# Patient Record
Sex: Female | Born: 1955
Health system: Southern US, Community
[De-identification: ages and names within clinical notes are randomized; demographics above are authoritative.]

## PROBLEM LIST (undated history)

## (undated) DIAGNOSIS — Z8619 Personal history of other infectious and parasitic diseases: Secondary | ICD-10-CM

## (undated) DIAGNOSIS — F419 Anxiety disorder, unspecified: Secondary | ICD-10-CM

## (undated) DIAGNOSIS — M1990 Unspecified osteoarthritis, unspecified site: Secondary | ICD-10-CM

## (undated) DIAGNOSIS — M8580 Other specified disorders of bone density and structure, unspecified site: Secondary | ICD-10-CM

## (undated) DIAGNOSIS — K219 Gastro-esophageal reflux disease without esophagitis: Secondary | ICD-10-CM

## (undated) HISTORY — DX: Unspecified osteoarthritis, unspecified site: M19.90

## (undated) HISTORY — PX: REPLACEMENT TOTAL KNEE: SUR1224

## (undated) HISTORY — DX: Personal history of other infectious and parasitic diseases: Z86.19

## (undated) HISTORY — DX: Gastro-esophageal reflux disease without esophagitis: K21.9

## (undated) HISTORY — PX: TONSILLECTOMY: SUR1361

## (undated) HISTORY — PX: TOTAL HIP ARTHROPLASTY: SHX124

## (undated) HISTORY — DX: Anxiety disorder, unspecified: F41.9

## (undated) HISTORY — DX: Other specified disorders of bone density and structure, unspecified site: M85.80

---

## 1997-04-15 ENCOUNTER — Ambulatory Visit (HOSPITAL_COMMUNITY): Admission: RE | Admit: 1997-04-15 | Discharge: 1997-04-15 | Payer: Self-pay | Admitting: Obstetrics and Gynecology

## 1997-08-03 ENCOUNTER — Emergency Department (HOSPITAL_COMMUNITY): Admission: EM | Admit: 1997-08-03 | Discharge: 1997-08-03 | Payer: Self-pay | Admitting: Emergency Medicine

## 1998-09-23 ENCOUNTER — Ambulatory Visit (HOSPITAL_COMMUNITY): Admission: RE | Admit: 1998-09-23 | Discharge: 1998-09-23 | Payer: Self-pay | Admitting: Obstetrics and Gynecology

## 1998-09-23 ENCOUNTER — Encounter: Payer: Self-pay | Admitting: Obstetrics and Gynecology

## 1999-08-04 ENCOUNTER — Ambulatory Visit (HOSPITAL_COMMUNITY): Admission: RE | Admit: 1999-08-04 | Discharge: 1999-08-04 | Payer: Self-pay | Admitting: Obstetrics and Gynecology

## 1999-08-04 ENCOUNTER — Encounter: Payer: Self-pay | Admitting: Obstetrics and Gynecology

## 2000-01-27 ENCOUNTER — Other Ambulatory Visit: Admission: RE | Admit: 2000-01-27 | Discharge: 2000-01-27 | Payer: Self-pay | Admitting: Obstetrics and Gynecology

## 2000-01-27 ENCOUNTER — Encounter (INDEPENDENT_AMBULATORY_CARE_PROVIDER_SITE_OTHER): Payer: Self-pay | Admitting: Specialist

## 2000-08-09 ENCOUNTER — Ambulatory Visit (HOSPITAL_COMMUNITY): Admission: RE | Admit: 2000-08-09 | Discharge: 2000-08-09 | Payer: Self-pay | Admitting: Obstetrics and Gynecology

## 2000-08-09 ENCOUNTER — Encounter: Payer: Self-pay | Admitting: Obstetrics and Gynecology

## 2001-04-03 ENCOUNTER — Other Ambulatory Visit: Admission: RE | Admit: 2001-04-03 | Discharge: 2001-04-03 | Payer: Self-pay | Admitting: Obstetrics and Gynecology

## 2001-08-10 ENCOUNTER — Encounter: Payer: Self-pay | Admitting: Obstetrics and Gynecology

## 2001-08-10 ENCOUNTER — Ambulatory Visit (HOSPITAL_COMMUNITY): Admission: RE | Admit: 2001-08-10 | Discharge: 2001-08-10 | Payer: Self-pay | Admitting: Obstetrics and Gynecology

## 2002-08-16 ENCOUNTER — Ambulatory Visit (HOSPITAL_COMMUNITY): Admission: RE | Admit: 2002-08-16 | Discharge: 2002-08-16 | Payer: Self-pay | Admitting: Obstetrics and Gynecology

## 2002-08-16 ENCOUNTER — Encounter: Payer: Self-pay | Admitting: Obstetrics and Gynecology

## 2003-11-04 ENCOUNTER — Ambulatory Visit (HOSPITAL_COMMUNITY): Admission: RE | Admit: 2003-11-04 | Discharge: 2003-11-04 | Payer: Self-pay | Admitting: Obstetrics and Gynecology

## 2005-01-21 ENCOUNTER — Ambulatory Visit (HOSPITAL_COMMUNITY): Admission: RE | Admit: 2005-01-21 | Discharge: 2005-01-21 | Payer: Self-pay | Admitting: Obstetrics and Gynecology

## 2006-03-03 ENCOUNTER — Ambulatory Visit (HOSPITAL_COMMUNITY): Admission: RE | Admit: 2006-03-03 | Discharge: 2006-03-03 | Payer: Self-pay | Admitting: Obstetrics and Gynecology

## 2007-03-16 ENCOUNTER — Ambulatory Visit (HOSPITAL_COMMUNITY): Admission: RE | Admit: 2007-03-16 | Discharge: 2007-03-16 | Payer: Self-pay | Admitting: Obstetrics and Gynecology

## 2008-03-27 ENCOUNTER — Ambulatory Visit (HOSPITAL_COMMUNITY): Admission: RE | Admit: 2008-03-27 | Discharge: 2008-03-27 | Payer: Self-pay | Admitting: Obstetrics and Gynecology

## 2009-05-06 ENCOUNTER — Ambulatory Visit (HOSPITAL_COMMUNITY): Admission: RE | Admit: 2009-05-06 | Discharge: 2009-05-06 | Payer: Self-pay | Admitting: Obstetrics and Gynecology

## 2010-05-28 ENCOUNTER — Other Ambulatory Visit (HOSPITAL_COMMUNITY): Payer: Self-pay | Admitting: Obstetrics and Gynecology

## 2010-05-28 DIAGNOSIS — Z1231 Encounter for screening mammogram for malignant neoplasm of breast: Secondary | ICD-10-CM

## 2010-06-08 ENCOUNTER — Ambulatory Visit (HOSPITAL_COMMUNITY)
Admission: RE | Admit: 2010-06-08 | Discharge: 2010-06-08 | Disposition: A | Discharge status: Home / Self Care | Payer: Self-pay | Source: Ambulatory Visit | Attending: Obstetrics and Gynecology | Admitting: Obstetrics and Gynecology

## 2010-06-08 DIAGNOSIS — Z1231 Encounter for screening mammogram for malignant neoplasm of breast: Secondary | ICD-10-CM | POA: Insufficient documentation

## 2011-05-27 ENCOUNTER — Other Ambulatory Visit (HOSPITAL_COMMUNITY): Payer: Self-pay | Admitting: Obstetrics and Gynecology

## 2011-05-27 DIAGNOSIS — Z1231 Encounter for screening mammogram for malignant neoplasm of breast: Secondary | ICD-10-CM

## 2011-07-08 ENCOUNTER — Ambulatory Visit (HOSPITAL_COMMUNITY)
Admission: RE | Admit: 2011-07-08 | Discharge: 2011-07-08 | Disposition: A | Discharge status: Home / Self Care | Payer: Self-pay | Source: Ambulatory Visit | Attending: Obstetrics and Gynecology | Admitting: Obstetrics and Gynecology

## 2011-07-08 DIAGNOSIS — Z1231 Encounter for screening mammogram for malignant neoplasm of breast: Secondary | ICD-10-CM | POA: Insufficient documentation

## 2012-06-26 ENCOUNTER — Other Ambulatory Visit (HOSPITAL_COMMUNITY): Payer: Self-pay | Admitting: Obstetrics and Gynecology

## 2012-06-26 DIAGNOSIS — Z1231 Encounter for screening mammogram for malignant neoplasm of breast: Secondary | ICD-10-CM

## 2012-07-11 ENCOUNTER — Ambulatory Visit (HOSPITAL_COMMUNITY): Payer: Self-pay

## 2012-07-20 ENCOUNTER — Ambulatory Visit (HOSPITAL_COMMUNITY)
Admission: RE | Admit: 2012-07-20 | Discharge: 2012-07-20 | Disposition: A | Discharge status: Home / Self Care | Payer: Self-pay | Source: Ambulatory Visit | Attending: Obstetrics and Gynecology | Admitting: Obstetrics and Gynecology

## 2012-07-20 DIAGNOSIS — Z1231 Encounter for screening mammogram for malignant neoplasm of breast: Secondary | ICD-10-CM | POA: Insufficient documentation

## 2013-09-25 ENCOUNTER — Ambulatory Visit: Payer: Self-pay | Admitting: Family Medicine

## 2013-10-02 ENCOUNTER — Ambulatory Visit (INDEPENDENT_AMBULATORY_CARE_PROVIDER_SITE_OTHER): Payer: BC Managed Care – PPO | Admitting: Family Medicine

## 2013-10-02 ENCOUNTER — Encounter: Payer: Self-pay | Admitting: Family Medicine

## 2013-10-02 VITALS — BP 100/70 | HR 69 | Temp 97.9°F | Ht 65.5 in | Wt 183.5 lb

## 2013-10-02 DIAGNOSIS — Z91030 Bee allergy status: Secondary | ICD-10-CM

## 2013-10-02 DIAGNOSIS — Z91038 Other insect allergy status: Secondary | ICD-10-CM

## 2013-10-02 DIAGNOSIS — Z7689 Persons encountering health services in other specified circumstances: Secondary | ICD-10-CM

## 2013-10-02 DIAGNOSIS — E669 Obesity, unspecified: Secondary | ICD-10-CM

## 2013-10-02 DIAGNOSIS — Z8742 Personal history of other diseases of the female genital tract: Secondary | ICD-10-CM

## 2013-10-02 DIAGNOSIS — Z7189 Other specified counseling: Secondary | ICD-10-CM

## 2013-10-02 DIAGNOSIS — R946 Abnormal results of thyroid function studies: Secondary | ICD-10-CM

## 2013-10-02 LAB — T4, FREE: FREE T4: 0.73 ng/dL (ref 0.60–1.60)

## 2013-10-02 LAB — TSH: TSH: 2.99 u[IU]/mL (ref 0.35–4.50)

## 2013-10-02 NOTE — Patient Instructions (Signed)
Vitamin D3 1000 IU dialy; 1200mg  of calcium in diet + or - supplement  -We have ordered labs or studies at this visit. It can take up to 1-2 weeks for results and processing. We will contact you with instructions IF your results are abnormal. Normal results will be released to your [facility]. If you have not heard from [place] or can not find your results in [facility] in 2 weeks please contact our office.  -PLEASE SIGN UP FOR MYCHART TODAY   We recommend the following healthy lifestyle measures: - eat a healthy diet consisting of lots of vegetables, fruits, beans, nuts, seeds, healthy meats such as white chicken and fish and whole grains.  - avoid fried foods, fast food, processed foods, sodas, red meet and other fattening foods.  - get a least 150 minutes of aerobic exercise per week.   Follow up in: 3 months or as needed

## 2013-10-02 NOTE — Progress Notes (Signed)
No chief complaint on file.   HPI:  Patricia Burns is here to establish care.  Last PCP and physical: 04/2013 with all labs done in 07/2013- TSH a little elevated at 8.03;   Has the following chronic problems and concerns today:  Patient Active Problem List   Diagnosis Date Noted  . Hx of abnormal cervical Pap smear - sees gyn 10/02/2013  . H/O bee sting allergy 10/02/2013  . Borderline abnormal TFTs 10/02/2013   Abnormal TFT: -TSH mildly elevated at gyn visit -some fatigue, but unchanged, occ cold intolerance, no lat eyebrown thinning -denies: constipation, weight gain, hair or skin changes, depression  ROS negative for unless reported above: fevers, unintentional weight loss, hearing or vision loss, chest pain, palpitations, struggling to breath, hemoptysis, melena, hematochezia, hematuria, falls, loc, si, thoughts of self harm  Past Medical History  Diagnosis Date  . History of chicken pox     Family History  Problem Relation Age of Onset  . Diabetes Father   . Arthritis Father   . Cancer Father     prostate  . Heart disease Paternal Grandmother   . Heart disease Paternal Grandfather     History   Social History  . Marital Status: Divorced    Spouse Name: N/A    Number of Children: N/A  . Years of Education: N/A   Social History Main Topics  . Smoking status: Never Smoker   . Smokeless tobacco: None  . Alcohol Use: None  . Drug Use: None  . Sexual Activity: None   Other Topics Concern  . None   Social History Narrative   Work or School: Acupuncturist Situation: lives with duaghter      Spiritual Beliefs: Christian      Lifestyle: no regular exercise; diet is ok             Current outpatient prescriptions:aspirin 81 MG tablet, Take 81 mg by mouth daily., Disp: , Rfl: ;  calcium carbonate (OS-CAL) 600 MG TABS tablet, Take 600 mg by mouth daily., Disp: , Rfl: ;  ibuprofen (ADVIL,MOTRIN) 200 MG tablet, Take 200 mg by mouth every 6 (six)  hours as needed., Disp: , Rfl: ;  Multiple Vitamins-Minerals (ALIVE WOMENS 50+ PO), Take by mouth daily., Disp: , Rfl:   EXAM:  Filed Vitals:   10/02/13 1114  BP: 100/70  Pulse: 69  Temp: 97.9 F (36.6 C)    Body mass index is 30.06 kg/(m^2).  GENERAL: vitals reviewed and listed above, alert, oriented, appears well hydrated and in no acute distress  HEENT: atraumatic, conjunttiva clear, no obvious abnormalities on inspection of external nose and ears  NECK: no obvious masses on inspection  LUNGS: clear to auscultation bilaterally, no wheezes, rales or rhonchi, good air movement  CV: HRRR, no peripheral edema  MS: moves all extremities without noticeable abnormality  PSYCH: pleasant and cooperative, no obvious depression or anxiety  ASSESSMENT AND PLAN:  Discussed the following assessment and plan:  Hx of abnormal cervical Pap smear - sees gyn  ? Of H/O bee sting allergy: -gave her number to call for evaluation with allergist, she will consider, emergency precuations  Borderline abnormal TFTs - Plan: TSH, T4, Free -repeat testing  Encounter to establish care  Obesity: -discussed importance of healthy diet and exercise  -We reviewed the PMH, PSH, FH, SH, Meds and Allergies. -We provided refills for any medications we will prescribe as needed. -We addressed current concerns per orders  and patient instructions. -We have asked for records for pertinent exams, studies, vaccines and notes from previous providers. -We have advised patient to follow up per instructions below.  Review health maintenance at next visit  -Patient advised to return or notify a doctor immediately if symptoms worsen or persist or new concerns arise.  Patient Instructions  Vitamin D3 1000 IU dialy; 1200mg  of calcium in diet + or - supplement  -We have ordered labs or studies at this visit. It can take up to 1-2 weeks for results and processing. We will contact you with instructions IF your  results are abnormal. Normal results will be released to your [facility]. If you have not heard from [place] or can not find your results in [facility] in 2 weeks please contact our office.  -PLEASE SIGN UP FOR MYCHART TODAY   We recommend the following healthy lifestyle measures: - eat a healthy diet consisting of lots of vegetables, fruits, beans, nuts, seeds, healthy meats such as white chicken and fish and whole grains.  - avoid fried foods, fast food, processed foods, sodas, red meet and other fattening foods.  - get a least 150 minutes of aerobic exercise per week.   Follow up in: 3 months or as needed      KIM, HANNAH R.

## 2013-10-02 NOTE — Progress Notes (Signed)
Pre visit review using our clinic review tool, if applicable. No additional management support is needed unless otherwise documented below in the visit note. 

## 2013-10-03 ENCOUNTER — Encounter: Payer: Self-pay | Admitting: *Deleted

## 2014-01-08 ENCOUNTER — Ambulatory Visit: Payer: BC Managed Care – PPO | Admitting: Family Medicine

## 2014-06-11 ENCOUNTER — Encounter: Payer: Self-pay | Admitting: Family Medicine

## 2014-06-11 ENCOUNTER — Ambulatory Visit (INDEPENDENT_AMBULATORY_CARE_PROVIDER_SITE_OTHER): Payer: BLUE CROSS/BLUE SHIELD | Admitting: Family Medicine

## 2014-06-11 VITALS — BP 112/61 | HR 70 | Temp 98.6°F | Ht 65.5 in | Wt 182.0 lb

## 2014-06-11 DIAGNOSIS — J020 Streptococcal pharyngitis: Secondary | ICD-10-CM

## 2014-06-11 LAB — POCT RAPID STREP A (OFFICE): RAPID STREP A SCREEN: NEGATIVE

## 2014-06-11 MED ORDER — CEPHALEXIN 500 MG PO CAPS: 500.0000 mg | ORAL_CAPSULE | Freq: Three times a day (TID) | ORAL | Status: AC

## 2014-06-11 NOTE — Progress Notes (Signed)
Pre visit review using our clinic review tool, if applicable. No additional management support is needed unless otherwise documented below in the visit note. 

## 2014-06-11 NOTE — Progress Notes (Signed)
   Subjective:    Patient ID: Patricia Burns, female    DOB: 1955-08-14, 59 y.o.   MRN: [id number]  HPI Here with 2 days of a ST. No fever or cough. She took care of her young grandchild over the weekend who was diagnosed with scarlet fever yesterday.   Review of Systems  Constitutional: Negative.   HENT: Positive for sore throat. Negative for congestion, postnasal drip and sinus pressure.   Eyes: Negative.   Respiratory: Negative.        Objective:   Physical Exam  Constitutional: She appears well-developed and well-nourished.  HENT:  Right Ear: External ear normal.  Left Ear: External ear normal.  Nose: Nose normal.  Posterior OP is red without exudate   Eyes: Conjunctivae are normal.  Neck: No thyromegaly present.  Pulmonary/Chest: Effort normal and breath sounds normal.  Lymphadenopathy:    She has no cervical adenopathy.          Assessment & Plan:  Probable strep throat. Treat with Keflex.

## 2015-06-19 ENCOUNTER — Encounter: Payer: Self-pay | Admitting: Family Medicine

## 2015-06-19 DIAGNOSIS — Z1389 Encounter for screening for other disorder: Secondary | ICD-10-CM | POA: Diagnosis not present

## 2015-06-19 DIAGNOSIS — Z01419 Encounter for gynecological examination (general) (routine) without abnormal findings: Secondary | ICD-10-CM | POA: Diagnosis not present

## 2015-06-19 DIAGNOSIS — Z124 Encounter for screening for malignant neoplasm of cervix: Secondary | ICD-10-CM | POA: Diagnosis not present

## 2015-06-19 DIAGNOSIS — Z130 Encounter for screening for diseases of the blood and blood-forming organs and certain disorders involving the immune mechanism: Secondary | ICD-10-CM | POA: Diagnosis not present

## 2015-06-19 LAB — HM PAP SMEAR

## 2015-07-20 DIAGNOSIS — Z1231 Encounter for screening mammogram for malignant neoplasm of breast: Secondary | ICD-10-CM | POA: Diagnosis not present

## 2015-09-04 DIAGNOSIS — Z01419 Encounter for gynecological examination (general) (routine) without abnormal findings: Secondary | ICD-10-CM | POA: Diagnosis not present

## 2015-12-21 DIAGNOSIS — M5431 Sciatica, right side: Secondary | ICD-10-CM | POA: Diagnosis not present

## 2015-12-21 DIAGNOSIS — M1611 Unilateral primary osteoarthritis, right hip: Secondary | ICD-10-CM | POA: Diagnosis not present

## 2015-12-24 ENCOUNTER — Other Ambulatory Visit: Payer: Self-pay | Admitting: Sports Medicine

## 2015-12-24 DIAGNOSIS — M545 Low back pain: Secondary | ICD-10-CM

## 2016-01-03 ENCOUNTER — Ambulatory Visit
Admission: RE | Admit: 2016-01-03 | Discharge: 2016-01-03 | Disposition: A | Discharge status: Home / Self Care | Payer: BLUE CROSS/BLUE SHIELD | Source: Ambulatory Visit | Attending: Sports Medicine | Admitting: Sports Medicine

## 2016-01-03 DIAGNOSIS — M48061 Spinal stenosis, lumbar region without neurogenic claudication: Secondary | ICD-10-CM | POA: Diagnosis not present

## 2016-01-03 DIAGNOSIS — M545 Low back pain: Secondary | ICD-10-CM

## 2016-01-05 DIAGNOSIS — M545 Low back pain: Secondary | ICD-10-CM | POA: Diagnosis not present

## 2016-01-20 DIAGNOSIS — M5431 Sciatica, right side: Secondary | ICD-10-CM | POA: Diagnosis not present

## 2016-01-20 DIAGNOSIS — M545 Low back pain: Secondary | ICD-10-CM | POA: Diagnosis not present

## 2016-02-05 DIAGNOSIS — M5431 Sciatica, right side: Secondary | ICD-10-CM | POA: Diagnosis not present

## 2016-02-05 DIAGNOSIS — M545 Low back pain: Secondary | ICD-10-CM | POA: Diagnosis not present

## 2016-03-01 DIAGNOSIS — M5106 Intervertebral disc disorders with myelopathy, lumbar region: Secondary | ICD-10-CM | POA: Diagnosis not present

## 2016-03-14 DIAGNOSIS — M25551 Pain in right hip: Secondary | ICD-10-CM | POA: Diagnosis not present

## 2016-03-14 DIAGNOSIS — M545 Low back pain: Secondary | ICD-10-CM | POA: Diagnosis not present

## 2016-03-23 DIAGNOSIS — M5106 Intervertebral disc disorders with myelopathy, lumbar region: Secondary | ICD-10-CM | POA: Diagnosis not present

## 2016-03-23 DIAGNOSIS — M25551 Pain in right hip: Secondary | ICD-10-CM | POA: Diagnosis not present

## 2016-03-24 ENCOUNTER — Ambulatory Visit (INDEPENDENT_AMBULATORY_CARE_PROVIDER_SITE_OTHER): Payer: BLUE CROSS/BLUE SHIELD | Admitting: Family Medicine

## 2016-03-24 ENCOUNTER — Encounter: Payer: Self-pay | Admitting: Family Medicine

## 2016-03-24 VITALS — BP 100/70 | HR 79 | Temp 97.5°F | Ht 65.5 in | Wt 185.5 lb

## 2016-03-24 DIAGNOSIS — Z01818 Encounter for other preprocedural examination: Secondary | ICD-10-CM

## 2016-03-24 DIAGNOSIS — Z23 Encounter for immunization: Secondary | ICD-10-CM | POA: Diagnosis not present

## 2016-03-24 LAB — CBC
HCT: 43.9 % (ref 36.0–46.0)
HEMOGLOBIN: 14.8 g/dL (ref 12.0–15.0)
MCHC: 33.8 g/dL (ref 30.0–36.0)
MCV: 95.3 fl (ref 78.0–100.0)
PLATELETS: 253 10*3/uL (ref 150.0–400.0)
RBC: 4.6 Mil/uL (ref 3.87–5.11)
RDW: 12.7 % (ref 11.5–15.5)
WBC: 8 10*3/uL (ref 4.0–10.5)

## 2016-03-24 LAB — BASIC METABOLIC PANEL
BUN: 17 mg/dL (ref 6–23)
CHLORIDE: 104 meq/L (ref 96–112)
CO2: 31 mEq/L (ref 19–32)
Calcium: 9.2 mg/dL (ref 8.4–10.5)
Creatinine, Ser: 0.79 mg/dL (ref 0.40–1.20)
GFR: 78.78 mL/min (ref >60.00)
Glucose, Bld: 79 mg/dL (ref 70–99)
POTASSIUM: 4 meq/L (ref 3.5–5.1)
SODIUM: 141 meq/L (ref 135–145)

## 2016-03-24 LAB — HEMOGLOBIN A1C: Hgb A1c MFr Bld: 5.5 % (ref 4.6–6.5)

## 2016-03-24 LAB — TSH: TSH: 3 u[IU]/mL (ref 0.35–4.50)

## 2016-03-24 NOTE — Patient Instructions (Addendum)
BEFORE YOU LEAVE: -offer cologard -obtain and update/abstract mammogram -EKG -flu shot -labs -follow up: physical in 3 months, come fasting if possible  We have ordered labs or studies at this visit. It can take up to 1-2 weeks for results and processing. IF results require follow up or explanation, we will call you with instructions. Clinically stable results will be released to your [facility]. If you have not heard from [place] or cannot find your results in [facility] in 2 weeks please contact our office at [phone].   If you are not yet signed up for [facility], please SIGN UP TODAY. We now offer online scheduling, same day appointments and extended hours. WHEN YOU DON'T FEEL YOUR BEST.Marland KitchenMarland KitchenWE ARE HERE TO HELP.   We recommend the following healthy lifestyle for LIFE: 1) Small portions.   Tip: eat off of a salad plate instead of a dinner plate.  Tip: if you need more or a snack choose fruits, veggies and/or a handful of nuts or seeds.  2) Eat a healthy clean diet.  * Tip: Avoid (less then 1 serving per week): processed foods, sweets, sweetened drinks, white starches (rice, flour, bread, potatoes, pasta, etc), red meat, fast foods, butter  *Tip: CHOOSE instead   * 5-9 servings per day of fresh or frozen fruits and vegetables (but not corn, potatoes, bananas, canned or dried fruit)   *nuts and seeds, beans   *olives and olive oil   *small portions of lean meats such as fish and white chicken    *small portions of whole grains  3)Get at least 150 minutes of sweaty aerobic exercise per week.  4)Reduce stress - consider counseling, meditation and relaxation to balance other aspects of your life.

## 2016-03-24 NOTE — Progress Notes (Signed)
Pre visit review using our clinic review tool, if applicable. No additional management support is needed unless otherwise documented below in the visit note. 

## 2016-03-24 NOTE — Progress Notes (Signed)
No chief complaint on file.   HPI:  Patient is seen for optimization of general medical care prior to surgery. She saw [place] once 3 years ago. Reports has not health problems and sees gyn. Reports up to date on mammogram and pap.  Surgery type: R Total hip replacement - with Dr. Percell Miller Date of surgery:  TBA  Kidney disease? No Prior surgeries/Issues following anesthesia? C - section, no known complications in the past Hx MI, heart arrythmia, CHF, angina or stroke? None, no DOE, cp, palpitations, cp with exertion Epilepsy or Seizures? none Arthritis or problems with neck or jaw? None,except ? TMJ Thyroid disease? Reports none, but then reports mild findings in the past with her gynecologist told not of concern Liver disease? none Asthma, COPD or chronic lung disease? none Diabetes? none (Needs to be evaluated by anesthesia if yes to these questions.)  Other: Poor nutrition, Frail or other: no  METS:  Cleans houses ?Can take care of self, such as eat, dress, or use the toilet (1 MET). yes ?Can walk up a flight of steps or a hill (4 METs).yes ?Can do heavy work around the house such as scrubbing floors or lifting or moving heavy furniture (between 4 and 10 METs). yes ?Can participate in strenuous sports such as swimming, singles tennis, football, basketball, and skiing (>10 METs): swims currently sometimes . AHA Risks: Major predictors that require intensive management and may lead to delay in or cancellation of the operative procedure unless emergent: NONE  . Unstable coronary syndromes including unstable or severe angina or recent MI  . Decompensated heart failure including NYHA functional class IV or worsening or new-onset HF  . Significant arrhythmias including high grade AV block, symptomatic ventricular arrhythmias, supraventricular arrhythmias with ventricular rate >100 bpm at rest, symptomatic bradycardia, and newly recognized ventricular tachycardia  . Severe heart valve disease  including severe aortic stenosis or symptomatic mitral stenosis   Other clinical predictors that warrant careful assessment of current status: NONE  . History of ischemic heart disease . History of cerebrovascular disease  . History of compensated heart failure or prior heart failure  . Diabetes mellitus  . Renal insufficiency  Type of surgery and Risk: 1) High risk (reported risk of cardiac death or nonfatal myocardial infarction [MI] often greater than 5 percent):  Marland Kitchen Aortic and other major vascular surgery  . Peripheral artery surgery   2)Intermediate risk (reported risk of cardiac death or nonfatal MI generally 1 to 5 percent):  Marland Kitchen Carotid endarterectomy  . Head and neck surgery  . Intraperitoneal and intrathoracic surgery  . Orthopedic surgery  . Prostate surgery   3)Low risk (reported risk of cardiac death or nonfatal MI generally less than 1 percent):  Marland Kitchen Ambulatory surgery  . Endoscopic procedures  . Superficial procedure  . Cataract surgery  . Breast surgery  Medications that need to be addressed prior to surgery: Per surgeon's recommendation - patient not taking anything now Discontinue acei/arbs/non-statin lipid lowering drugs day of surgery ASA stop 7 days before or discuss with cardiology if CV risks, other anticoagulants discuss with cardiology.  ROS: See pertinent positives and negatives per HPI. 11 point ROS negative except where noted.   Past Medical History:  Diagnosis Date  . History of chicken pox     Past Surgical History:  Procedure Laterality Date  . CESAREAN SECTION     x2-1983, 1984  . TONSILLECTOMY      Family History  Problem Relation Age of Onset  . Diabetes  Father   . Arthritis Father   . Cancer Father     prostate  . Heart disease Paternal Grandmother   . Heart disease Paternal Grandfather     Social History   Social History  . Marital status: Divorced    Spouse name: N/A  . Number of children: N/A  . Years of education: N/A    Social History Main Topics  . Smoking status: Never Smoker  . Smokeless tobacco: Never Used  . Alcohol use 0.0 oz/week     Comment: occ  . Drug use: No  . Sexual activity: Not Asked   Other Topics Concern  . 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  Ascorbic Acid (VITAMIN C PO), Take by mouth., Disp: , Rfl:  .  calcium carbonate (OS-CAL) 600 MG TABS tablet, Take 600 mg by mouth daily., Disp: , Rfl:  .  ibuprofen (ADVIL,MOTRIN) 200 MG tablet, Take 200 mg by mouth every 6 (six) hours as needed., Disp: , Rfl:  .  Multiple Vitamins-Minerals (ALIVE WOMENS 50+ PO), Take by mouth daily., Disp: , Rfl:   EXAM:  Vitals:   03/24/16 1051  BP: 100/70  Pulse: 79  Temp: 97.5 F (36.4 C)    Body mass index is 30.4 kg/m.  GENERAL: vitals reviewed and listed above, alert, oriented, appears well hydrated and in no acute distress  HEENT: atraumatic, conjunttiva clear, no obvious abnormalities on inspection of external nose and ears  NECK: no obvious masses on inspection, no carotid bruits  LUNGS: clear to auscultation bilaterally, no wheezes, rales or rhonchi, good air movement  CV: HRRR, no peripheral edema, no JVD, BP normal range, normal radial pulses  MS: moves all extremities without noticeable abnormality  PSYCH: pleasant and cooperative, no obvious depression or anxiety  ASSESSMENT AND PLAN:  Discussed the following assessment and plan: More than 50% of over 30 minutes minutes spent in total in caring for this patient was spent face-to-face with the patient, counseling and/or coordinating care.    Preop examination - Plan: Basic metabolic panel, CBC, Hemoglobin A1c, TSH, EKG 12-Lead, EKG 12-Lead  Encounter for immunization - Plan: Flu Vaccine QUAD 36+ mos  IM  Assessment: -Risk factors: none -Surgery Risks:intermediate -age, nutritional status, fraility: good nutritional status, age >1, no fraility -functional capacity: > 4 METs wethout symptoms -comorbidities: none Patient Specific Risks: patient is low risk for intermediate risks surgery  Recommendations for optimizing general medical care prior to surgery: -given lack of regular medical care and that I have not seen her but once several years ago obtained EKG, labs  -advise evaluation with anesthesiology prior to any general anesthesia given possible hx thyroid issues and TMJ -advised patient to discuss specific risks morbidity and mortality of surgery with surgeon, CV risks discussed with patient -advised patient will defer to surgeon for post-op DVT prophylaxis and post op care -no specific medical recommendations for this patient at this time and no recommendations to defer surgery or for further CV testing prior to surgery   -Patient advised to return or notify a doctor immediately if symptoms worsen or persist or new concerns arise.  Patient Instructions  BEFORE YOU LEAVE: -offer cologard -obtain and  update/abstract mammogram -EKG -flu shot -labs -follow up: physical in 3 months, come fasting if possible  We have ordered labs or studies at this visit. It can take up to 1-2 weeks for results and processing. IF results require follow up or explanation, we will call you with instructions. Clinically stable results will be released to your [facility]. If you have not heard from [place] or cannot find your results in [facility] in 2 weeks please contact our office at [phone].   If you are not yet signed up for [facility], please SIGN UP TODAY. We now offer online scheduling, same day appointments and extended hours. WHEN YOU DON'T FEEL YOUR BEST.Marland KitchenMarland KitchenWE ARE HERE TO HELP.   We recommend the following healthy lifestyle for LIFE: 1) Small portions.   Tip: eat off of a salad plate instead of  a dinner plate.  Tip: if you need more or a snack choose fruits, veggies and/or a handful of nuts or seeds.  2) Eat a healthy clean diet.  * Tip: Avoid (less then 1 serving per week): processed foods, sweets, sweetened drinks, white starches (rice, flour, bread, potatoes, pasta, etc), red meat, fast foods, butter  *Tip: CHOOSE instead   * 5-9 servings per day of fresh or frozen fruits and vegetables (but not corn, potatoes, bananas, canned or dried fruit)   *nuts and seeds, beans   *olives and olive oil   *small portions of lean meats such as fish and white chicken    *small portions of whole grains  3)Get at least 150 minutes of sweaty aerobic exercise per week.  4)Reduce stress - consider counseling, meditation and relaxation to balance other aspects of your life.               Colin Benton R.

## 2016-04-04 DIAGNOSIS — M545 Low back pain: Secondary | ICD-10-CM | POA: Diagnosis not present

## 2016-04-04 DIAGNOSIS — M1712 Unilateral primary osteoarthritis, left knee: Secondary | ICD-10-CM | POA: Diagnosis not present

## 2016-04-04 DIAGNOSIS — M1611 Unilateral primary osteoarthritis, right hip: Secondary | ICD-10-CM | POA: Diagnosis not present

## 2016-04-07 DIAGNOSIS — M1611 Unilateral primary osteoarthritis, right hip: Secondary | ICD-10-CM | POA: Diagnosis not present

## 2016-04-12 DIAGNOSIS — M1611 Unilateral primary osteoarthritis, right hip: Secondary | ICD-10-CM | POA: Diagnosis not present

## 2016-04-12 DIAGNOSIS — Z96641 Presence of right artificial hip joint: Secondary | ICD-10-CM | POA: Diagnosis not present

## 2016-04-12 DIAGNOSIS — R262 Difficulty in walking, not elsewhere classified: Secondary | ICD-10-CM | POA: Diagnosis not present

## 2016-04-12 DIAGNOSIS — M6281 Muscle weakness (generalized): Secondary | ICD-10-CM | POA: Diagnosis not present

## 2016-04-12 DIAGNOSIS — M25551 Pain in right hip: Secondary | ICD-10-CM | POA: Diagnosis not present

## 2016-04-20 DIAGNOSIS — R262 Difficulty in walking, not elsewhere classified: Secondary | ICD-10-CM | POA: Diagnosis not present

## 2016-04-20 DIAGNOSIS — M6281 Muscle weakness (generalized): Secondary | ICD-10-CM | POA: Diagnosis not present

## 2016-04-20 DIAGNOSIS — M1712 Unilateral primary osteoarthritis, left knee: Secondary | ICD-10-CM | POA: Diagnosis not present

## 2016-04-20 DIAGNOSIS — M25551 Pain in right hip: Secondary | ICD-10-CM | POA: Diagnosis not present

## 2016-04-20 DIAGNOSIS — M1611 Unilateral primary osteoarthritis, right hip: Secondary | ICD-10-CM | POA: Diagnosis not present

## 2016-04-28 DIAGNOSIS — M1611 Unilateral primary osteoarthritis, right hip: Secondary | ICD-10-CM | POA: Diagnosis not present

## 2016-04-28 DIAGNOSIS — M6281 Muscle weakness (generalized): Secondary | ICD-10-CM | POA: Diagnosis not present

## 2016-04-28 DIAGNOSIS — M25551 Pain in right hip: Secondary | ICD-10-CM | POA: Diagnosis not present

## 2016-04-28 DIAGNOSIS — R262 Difficulty in walking, not elsewhere classified: Secondary | ICD-10-CM | POA: Diagnosis not present

## 2016-04-29 DIAGNOSIS — M1712 Unilateral primary osteoarthritis, left knee: Secondary | ICD-10-CM | POA: Diagnosis not present

## 2016-05-04 DIAGNOSIS — M1712 Unilateral primary osteoarthritis, left knee: Secondary | ICD-10-CM | POA: Diagnosis not present

## 2016-05-05 DIAGNOSIS — M6281 Muscle weakness (generalized): Secondary | ICD-10-CM | POA: Diagnosis not present

## 2016-05-05 DIAGNOSIS — M25551 Pain in right hip: Secondary | ICD-10-CM | POA: Diagnosis not present

## 2016-05-05 DIAGNOSIS — M1611 Unilateral primary osteoarthritis, right hip: Secondary | ICD-10-CM | POA: Diagnosis not present

## 2016-05-05 DIAGNOSIS — Z96641 Presence of right artificial hip joint: Secondary | ICD-10-CM | POA: Diagnosis not present

## 2016-05-05 DIAGNOSIS — R262 Difficulty in walking, not elsewhere classified: Secondary | ICD-10-CM | POA: Diagnosis not present

## 2016-05-11 DIAGNOSIS — M25551 Pain in right hip: Secondary | ICD-10-CM | POA: Diagnosis not present

## 2016-05-11 DIAGNOSIS — Z96641 Presence of right artificial hip joint: Secondary | ICD-10-CM | POA: Diagnosis not present

## 2016-05-11 DIAGNOSIS — M1611 Unilateral primary osteoarthritis, right hip: Secondary | ICD-10-CM | POA: Diagnosis not present

## 2016-05-11 DIAGNOSIS — M6281 Muscle weakness (generalized): Secondary | ICD-10-CM | POA: Diagnosis not present

## 2016-06-15 DIAGNOSIS — M1611 Unilateral primary osteoarthritis, right hip: Secondary | ICD-10-CM | POA: Diagnosis not present

## 2016-06-20 DIAGNOSIS — Z1231 Encounter for screening mammogram for malignant neoplasm of breast: Secondary | ICD-10-CM | POA: Diagnosis not present

## 2016-06-20 DIAGNOSIS — Z124 Encounter for screening for malignant neoplasm of cervix: Secondary | ICD-10-CM | POA: Diagnosis not present

## 2016-06-20 DIAGNOSIS — Z1389 Encounter for screening for other disorder: Secondary | ICD-10-CM | POA: Diagnosis not present

## 2016-06-20 DIAGNOSIS — Z130 Encounter for screening for diseases of the blood and blood-forming organs and certain disorders involving the immune mechanism: Secondary | ICD-10-CM | POA: Diagnosis not present

## 2016-06-20 DIAGNOSIS — Z01419 Encounter for gynecological examination (general) (routine) without abnormal findings: Secondary | ICD-10-CM | POA: Diagnosis not present

## 2016-06-20 DIAGNOSIS — Z6830 Body mass index (BMI) 30.0-30.9, adult: Secondary | ICD-10-CM | POA: Diagnosis not present

## 2016-06-20 LAB — CBC AND DIFFERENTIAL: Hemoglobin: 13.2 g/dL (ref 12.0–16.0)

## 2016-06-21 DIAGNOSIS — Z1211 Encounter for screening for malignant neoplasm of colon: Secondary | ICD-10-CM | POA: Diagnosis not present

## 2016-06-21 DIAGNOSIS — Z124 Encounter for screening for malignant neoplasm of cervix: Secondary | ICD-10-CM | POA: Diagnosis not present

## 2016-06-23 DIAGNOSIS — M1712 Unilateral primary osteoarthritis, left knee: Secondary | ICD-10-CM | POA: Diagnosis not present

## 2016-06-23 DIAGNOSIS — M25562 Pain in left knee: Secondary | ICD-10-CM | POA: Diagnosis not present

## 2016-07-18 DIAGNOSIS — M1712 Unilateral primary osteoarthritis, left knee: Secondary | ICD-10-CM | POA: Diagnosis not present

## 2016-07-25 DIAGNOSIS — M25662 Stiffness of left knee, not elsewhere classified: Secondary | ICD-10-CM | POA: Diagnosis not present

## 2016-07-25 DIAGNOSIS — M1712 Unilateral primary osteoarthritis, left knee: Secondary | ICD-10-CM | POA: Diagnosis not present

## 2016-07-25 DIAGNOSIS — R262 Difficulty in walking, not elsewhere classified: Secondary | ICD-10-CM | POA: Diagnosis not present

## 2016-07-25 DIAGNOSIS — M25562 Pain in left knee: Secondary | ICD-10-CM | POA: Diagnosis not present

## 2016-07-27 DIAGNOSIS — M25562 Pain in left knee: Secondary | ICD-10-CM | POA: Diagnosis not present

## 2016-07-27 DIAGNOSIS — R262 Difficulty in walking, not elsewhere classified: Secondary | ICD-10-CM | POA: Diagnosis not present

## 2016-07-27 DIAGNOSIS — M1712 Unilateral primary osteoarthritis, left knee: Secondary | ICD-10-CM | POA: Diagnosis not present

## 2016-07-27 DIAGNOSIS — M25662 Stiffness of left knee, not elsewhere classified: Secondary | ICD-10-CM | POA: Diagnosis not present

## 2016-07-27 DIAGNOSIS — R531 Weakness: Secondary | ICD-10-CM | POA: Diagnosis not present

## 2016-08-04 DIAGNOSIS — M25662 Stiffness of left knee, not elsewhere classified: Secondary | ICD-10-CM | POA: Diagnosis not present

## 2016-08-04 DIAGNOSIS — R262 Difficulty in walking, not elsewhere classified: Secondary | ICD-10-CM | POA: Diagnosis not present

## 2016-08-04 DIAGNOSIS — M25562 Pain in left knee: Secondary | ICD-10-CM | POA: Diagnosis not present

## 2016-08-04 DIAGNOSIS — M1712 Unilateral primary osteoarthritis, left knee: Secondary | ICD-10-CM | POA: Diagnosis not present

## 2016-08-08 DIAGNOSIS — M1712 Unilateral primary osteoarthritis, left knee: Secondary | ICD-10-CM | POA: Diagnosis not present

## 2016-08-08 DIAGNOSIS — M25562 Pain in left knee: Secondary | ICD-10-CM | POA: Diagnosis not present

## 2016-08-08 DIAGNOSIS — R531 Weakness: Secondary | ICD-10-CM | POA: Diagnosis not present

## 2016-08-08 DIAGNOSIS — R262 Difficulty in walking, not elsewhere classified: Secondary | ICD-10-CM | POA: Diagnosis not present

## 2016-08-08 DIAGNOSIS — M25662 Stiffness of left knee, not elsewhere classified: Secondary | ICD-10-CM | POA: Diagnosis not present

## 2016-08-10 DIAGNOSIS — M25662 Stiffness of left knee, not elsewhere classified: Secondary | ICD-10-CM | POA: Diagnosis not present

## 2016-08-10 DIAGNOSIS — R531 Weakness: Secondary | ICD-10-CM | POA: Diagnosis not present

## 2016-08-10 DIAGNOSIS — M1712 Unilateral primary osteoarthritis, left knee: Secondary | ICD-10-CM | POA: Diagnosis not present

## 2016-08-10 DIAGNOSIS — R262 Difficulty in walking, not elsewhere classified: Secondary | ICD-10-CM | POA: Diagnosis not present

## 2016-08-10 DIAGNOSIS — M25562 Pain in left knee: Secondary | ICD-10-CM | POA: Diagnosis not present

## 2016-08-15 DIAGNOSIS — M1712 Unilateral primary osteoarthritis, left knee: Secondary | ICD-10-CM | POA: Diagnosis not present

## 2016-08-15 DIAGNOSIS — R262 Difficulty in walking, not elsewhere classified: Secondary | ICD-10-CM | POA: Diagnosis not present

## 2016-08-15 DIAGNOSIS — M25562 Pain in left knee: Secondary | ICD-10-CM | POA: Diagnosis not present

## 2016-08-15 DIAGNOSIS — M25662 Stiffness of left knee, not elsewhere classified: Secondary | ICD-10-CM | POA: Diagnosis not present

## 2016-08-15 DIAGNOSIS — R531 Weakness: Secondary | ICD-10-CM | POA: Diagnosis not present

## 2016-08-18 DIAGNOSIS — R531 Weakness: Secondary | ICD-10-CM | POA: Diagnosis not present

## 2016-08-18 DIAGNOSIS — M25662 Stiffness of left knee, not elsewhere classified: Secondary | ICD-10-CM | POA: Diagnosis not present

## 2016-08-18 DIAGNOSIS — M1712 Unilateral primary osteoarthritis, left knee: Secondary | ICD-10-CM | POA: Diagnosis not present

## 2016-08-18 DIAGNOSIS — M25562 Pain in left knee: Secondary | ICD-10-CM | POA: Diagnosis not present

## 2016-08-22 DIAGNOSIS — M25662 Stiffness of left knee, not elsewhere classified: Secondary | ICD-10-CM | POA: Diagnosis not present

## 2016-08-22 DIAGNOSIS — M25562 Pain in left knee: Secondary | ICD-10-CM | POA: Diagnosis not present

## 2016-08-22 DIAGNOSIS — M1712 Unilateral primary osteoarthritis, left knee: Secondary | ICD-10-CM | POA: Diagnosis not present

## 2016-08-22 DIAGNOSIS — R262 Difficulty in walking, not elsewhere classified: Secondary | ICD-10-CM | POA: Diagnosis not present

## 2016-08-25 DIAGNOSIS — M1712 Unilateral primary osteoarthritis, left knee: Secondary | ICD-10-CM | POA: Diagnosis not present

## 2016-08-25 DIAGNOSIS — M25662 Stiffness of left knee, not elsewhere classified: Secondary | ICD-10-CM | POA: Diagnosis not present

## 2016-08-25 DIAGNOSIS — M25562 Pain in left knee: Secondary | ICD-10-CM | POA: Diagnosis not present

## 2016-08-25 DIAGNOSIS — R262 Difficulty in walking, not elsewhere classified: Secondary | ICD-10-CM | POA: Diagnosis not present

## 2016-08-29 DIAGNOSIS — M25562 Pain in left knee: Secondary | ICD-10-CM | POA: Diagnosis not present

## 2016-08-29 DIAGNOSIS — R531 Weakness: Secondary | ICD-10-CM | POA: Diagnosis not present

## 2016-08-29 DIAGNOSIS — Z1211 Encounter for screening for malignant neoplasm of colon: Secondary | ICD-10-CM | POA: Diagnosis not present

## 2016-08-29 DIAGNOSIS — M1712 Unilateral primary osteoarthritis, left knee: Secondary | ICD-10-CM | POA: Diagnosis not present

## 2016-08-29 DIAGNOSIS — M25662 Stiffness of left knee, not elsewhere classified: Secondary | ICD-10-CM | POA: Diagnosis not present

## 2016-09-05 DIAGNOSIS — R262 Difficulty in walking, not elsewhere classified: Secondary | ICD-10-CM | POA: Diagnosis not present

## 2016-09-05 DIAGNOSIS — M25662 Stiffness of left knee, not elsewhere classified: Secondary | ICD-10-CM | POA: Diagnosis not present

## 2016-09-05 DIAGNOSIS — M25562 Pain in left knee: Secondary | ICD-10-CM | POA: Diagnosis not present

## 2016-09-05 DIAGNOSIS — M1712 Unilateral primary osteoarthritis, left knee: Secondary | ICD-10-CM | POA: Diagnosis not present

## 2016-09-07 DIAGNOSIS — M25562 Pain in left knee: Secondary | ICD-10-CM | POA: Diagnosis not present

## 2016-09-07 DIAGNOSIS — M1712 Unilateral primary osteoarthritis, left knee: Secondary | ICD-10-CM | POA: Diagnosis not present

## 2016-09-07 DIAGNOSIS — M25662 Stiffness of left knee, not elsewhere classified: Secondary | ICD-10-CM | POA: Diagnosis not present

## 2016-09-07 DIAGNOSIS — R262 Difficulty in walking, not elsewhere classified: Secondary | ICD-10-CM | POA: Diagnosis not present

## 2016-09-07 DIAGNOSIS — R531 Weakness: Secondary | ICD-10-CM | POA: Diagnosis not present

## 2016-09-12 DIAGNOSIS — M1712 Unilateral primary osteoarthritis, left knee: Secondary | ICD-10-CM | POA: Diagnosis not present

## 2016-09-12 DIAGNOSIS — R531 Weakness: Secondary | ICD-10-CM | POA: Diagnosis not present

## 2016-09-12 DIAGNOSIS — M25662 Stiffness of left knee, not elsewhere classified: Secondary | ICD-10-CM | POA: Diagnosis not present

## 2016-09-12 DIAGNOSIS — R262 Difficulty in walking, not elsewhere classified: Secondary | ICD-10-CM | POA: Diagnosis not present

## 2016-09-12 DIAGNOSIS — M25562 Pain in left knee: Secondary | ICD-10-CM | POA: Diagnosis not present

## 2016-09-14 DIAGNOSIS — M25662 Stiffness of left knee, not elsewhere classified: Secondary | ICD-10-CM | POA: Diagnosis not present

## 2016-09-14 DIAGNOSIS — R531 Weakness: Secondary | ICD-10-CM | POA: Diagnosis not present

## 2016-09-14 DIAGNOSIS — R262 Difficulty in walking, not elsewhere classified: Secondary | ICD-10-CM | POA: Diagnosis not present

## 2016-09-14 DIAGNOSIS — M25562 Pain in left knee: Secondary | ICD-10-CM | POA: Diagnosis not present

## 2016-09-15 DIAGNOSIS — M25551 Pain in right hip: Secondary | ICD-10-CM | POA: Diagnosis not present

## 2016-09-19 DIAGNOSIS — M25562 Pain in left knee: Secondary | ICD-10-CM | POA: Diagnosis not present

## 2016-09-19 DIAGNOSIS — R262 Difficulty in walking, not elsewhere classified: Secondary | ICD-10-CM | POA: Diagnosis not present

## 2016-09-19 DIAGNOSIS — M25662 Stiffness of left knee, not elsewhere classified: Secondary | ICD-10-CM | POA: Diagnosis not present

## 2016-09-19 DIAGNOSIS — R531 Weakness: Secondary | ICD-10-CM | POA: Diagnosis not present

## 2016-09-26 DIAGNOSIS — M25562 Pain in left knee: Secondary | ICD-10-CM | POA: Diagnosis not present

## 2016-09-26 DIAGNOSIS — R531 Weakness: Secondary | ICD-10-CM | POA: Diagnosis not present

## 2016-09-26 DIAGNOSIS — M25662 Stiffness of left knee, not elsewhere classified: Secondary | ICD-10-CM | POA: Diagnosis not present

## 2016-09-26 DIAGNOSIS — R262 Difficulty in walking, not elsewhere classified: Secondary | ICD-10-CM | POA: Diagnosis not present

## 2016-10-13 DIAGNOSIS — M25562 Pain in left knee: Secondary | ICD-10-CM | POA: Diagnosis not present

## 2016-12-29 ENCOUNTER — Encounter: Payer: Self-pay | Admitting: Family Medicine

## 2016-12-29 ENCOUNTER — Ambulatory Visit (INDEPENDENT_AMBULATORY_CARE_PROVIDER_SITE_OTHER): Payer: BLUE CROSS/BLUE SHIELD | Admitting: Family Medicine

## 2016-12-29 ENCOUNTER — Encounter: Payer: Self-pay | Admitting: Gastroenterology

## 2016-12-29 VITALS — BP 98/60 | HR 73 | Temp 97.5°F | Ht 65.5 in | Wt 198.7 lb

## 2016-12-29 DIAGNOSIS — R1310 Dysphagia, unspecified: Secondary | ICD-10-CM

## 2016-12-29 DIAGNOSIS — Z6832 Body mass index (BMI) 32.0-32.9, adult: Secondary | ICD-10-CM | POA: Diagnosis not present

## 2016-12-29 DIAGNOSIS — K219 Gastro-esophageal reflux disease without esophagitis: Secondary | ICD-10-CM | POA: Diagnosis not present

## 2016-12-29 DIAGNOSIS — Z23 Encounter for immunization: Secondary | ICD-10-CM

## 2016-12-29 DIAGNOSIS — Z1211 Encounter for screening for malignant neoplasm of colon: Secondary | ICD-10-CM

## 2016-12-29 NOTE — Addendum Note (Signed)
Addended by: Agnes Lawrence on: 12/29/2016 10:28 AM   Modules accepted: Orders

## 2016-12-29 NOTE — Progress Notes (Signed)
HPI:  Acute visit for acid reflux.  She reports she has had issues with acid for several years, but they are worsening a lot over the last year.  She has daily heartburn, acid in the throat, reflux of material into the throat, occasional dysphasia and occasional nausea and vomiting.  She had several orthopedic surgeries this year and did gain some weight.  She does drink wine on a regular basis and caffeine.  Denies weight loss, fevers, malaise, melena, hematochezia.  She has never had a colon cancer screening.  She is interested in seeing the gastroenterologist.  ROS: See pertinent positives and negatives per HPI.  Past Medical History:  Diagnosis Date  . History of chicken pox     Past Surgical History:  Procedure Laterality Date  . CESAREAN SECTION     x2-1983, 1984  . TONSILLECTOMY      Family History  Problem Relation Age of Onset  . Diabetes Father   . Arthritis Father   . Cancer Father        prostate  . Heart disease Paternal Grandmother   . Heart disease Paternal Grandfather     Social History   Socioeconomic History  . Marital status: Divorced    Spouse name: None  . Number of children: None  . Years of education: None  . Highest education level: None  Social Needs  . Financial resource strain: None  . Food insecurity - worry: None  . Food insecurity - inability: None  . Transportation needs - medical: None  . Transportation needs - non-medical: None  Occupational History  . None  Tobacco Use  . Smoking status: Never Smoker  . Smokeless tobacco: Never Used  Substance and Sexual Activity  . Alcohol use: Yes    Alcohol/week: 0.0 oz    Comment: occ  . Drug use: No  . Sexual activity: None  Other Topics Concern  . None  Social History Narrative   Work or School: Acupuncturist Situation: lives with duaghter      Spiritual Beliefs: Christian      Lifestyle: no regular exercise; diet is ok              Current Outpatient Medications:   .  Ascorbic Acid (VITAMIN C PO), Take by mouth., Disp: , Rfl:  .  aspirin EC 81 MG tablet, Take 81 mg daily by mouth., Disp: , Rfl:  .  calcium carbonate (OS-CAL) 600 MG TABS tablet, Take 600 mg by mouth daily., Disp: , Rfl:  .  ibuprofen (ADVIL,MOTRIN) 200 MG tablet, Take 200 mg by mouth every 6 (six) hours as needed., Disp: , Rfl:  .  Multiple Vitamins-Minerals (ALIVE WOMENS 50+ PO), Take by mouth daily., Disp: , Rfl:   EXAM:  Vitals:   12/29/16 0936  BP: 98/60  Pulse: 73  Temp: (!) 97.5 F (36.4 C)    Body mass index is 32.56 kg/m.  GENERAL: vitals reviewed and listed above, alert, oriented, appears well hydrated and in no acute distress  HEENT: atraumatic, conjunttiva clear, no obvious abnormalities on inspection of external nose and ears  NECK: no obvious masses on inspection  LUNGS: clear to auscultation bilaterally, no wheezes, rales or rhonchi, good air movement  CV: HRRR, no peripheral edema  ABD: Bowel sounds positive in all 4 quadrants, soft, nontender to palpation   MS: moves all extremities without noticeable abnormality   PSYCH: pleasant and cooperative, no obvious depression or anxiety  ASSESSMENT  AND PLAN:  Discussed the following assessment and plan:  Gastroesophageal reflux disease, esophagitis presence not specified  Dysphagia, unspecified type  Colon cancer screening  BMI 32.0-32.9,adult  -Advised to healthy diet and weight reduction, follow-up physical in 3-4 months -Dietary recommendations and lifestyle recommendations for GERD -Given the severe symptoms and dysphasia, advised a gastroenterology consult along with starting an over-the-counter acid reducer in the interim, referral placed, she is also due for colon cancer screening and I advised that she discuss this when she sees the gastroenterologist Flu shot today- -Patient advised to return or notify a doctor immediately if symptoms worsen or persist or new concerns arise.  Patient  Instructions  BEFORE YOU LEAVE: -Flu shot -follow up: Physical exam in 3 months, please come fasting if possible  Take the omeprazole daily.  Try the lifestyle changes below. Work on eating small, healthy meals and getting regular aerobic exercise.  Follow-up sooner if symptoms are worsening or you have new concerns.  We placed a referral for you as discussed. It usually takes about 1-2 weeks to process and schedule this referral. If you have not heard from [place] regarding this appointment in 2 weeks please contact our office.   Heartburn Heartburn is a type of pain or discomfort that can happen in the throat or chest. It is often described as a burning pain. It may also cause a bad taste in the mouth. Heartburn may feel worse when you lie down or bend over. It may be caused by stomach contents that move back up (reflux) into the tube that connects the mouth with the stomach (esophagus). Follow these instructions at home: Take these actions to lessen your discomfort and to help avoid problems. Diet  Follow a diet as told by your doctor. You may need to avoid foods and drinks such as: ? Coffee and tea (with or without caffeine). ? Drinks that contain alcohol. ? Energy drinks and sports drinks. ? Carbonated drinks or sodas. ? Chocolate and cocoa. ? Peppermint and mint flavorings. ? Garlic and onions. ? Horseradish. ? Spicy and acidic foods, such as peppers, chili powder, curry powder, vinegar, hot sauces, and BBQ sauce. ? Citrus fruit juices and citrus fruits, such as oranges, lemons, and limes. ? Tomato-based foods, such as red sauce, chili, salsa, and pizza with red sauce. ? Fried and fatty foods, such as donuts, french fries, potato chips, and high-fat dressings. ? High-fat meats, such as hot dogs, rib eye steak, sausage, ham, and bacon. ? High-fat dairy items, such as whole milk, butter, and cream cheese.  Eat small meals often. Avoid eating large meals.  Avoid drinking large  amounts of liquid with your meals.  Avoid eating meals during the 2-3 hours before bedtime.  Avoid lying down right after you eat.  Do not exercise right after you eat. General instructions  Pay attention to any changes in your symptoms.  Take over-the-counter and prescription medicines only as told by your doctor. Do not take aspirin, ibuprofen, or other NSAIDs unless your doctor says it is okay.  Do not use any tobacco products, including cigarettes, chewing tobacco, and e-cigarettes. If you need help quitting, ask your doctor.  Wear loose clothes. Do not wear anything tight around your waist.  Raise (elevate) the head of your bed about 6 inches (15 cm).  Try to lower your stress. If you need help doing this, ask your doctor.  If you are overweight, lose an amount of weight that is healthy for you. Ask your  doctor about a safe weight loss goal.  Keep all follow-up visits as told by your doctor. This is important. Contact a doctor if:  You have new symptoms.  You lose weight and you do not know why it is happening.  You have trouble swallowing, or it hurts to swallow.  You have wheezing or a cough that keeps happening.  Your symptoms do not get better with treatment.  You have heartburn often for more than two weeks. Get help right away if:  You have pain in your arms, neck, jaw, teeth, or back.  You feel sweaty, dizzy, or light-headed.  You have chest pain or shortness of breath.  You throw up (vomit) and your throw up looks like blood or coffee grounds.  Your poop (stool) is bloody or black. This information is not intended to replace advice given to you by your health care provider. Make sure you discuss any questions you have with your health care provider. Document Released: 10/13/2010 Document Revised: 07/09/2015 Document Reviewed: 05/28/2014 Elsevier Interactive Patient Education  2018 [place]., DO

## 2016-12-29 NOTE — Patient Instructions (Addendum)
BEFORE YOU LEAVE: -Flu shot -follow up: Physical exam in 3 months, please come fasting if possible  Take the omeprazole daily.  Try the lifestyle changes below. Work on eating small, healthy meals and getting regular aerobic exercise.  Follow-up sooner if symptoms are worsening or you have new concerns.  We placed a referral for you as discussed. It usually takes about 1-2 weeks to process and schedule this referral. If you have not heard from [place] regarding this appointment in 2 weeks please contact our office.   Heartburn Heartburn is a type of pain or discomfort that can happen in the throat or chest. It is often described as a burning pain. It may also cause a bad taste in the mouth. Heartburn may feel worse when you lie down or bend over. It may be caused by stomach contents that move back up (reflux) into the tube that connects the mouth with the stomach (esophagus). Follow these instructions at home: Take these actions to lessen your discomfort and to help avoid problems. Diet  Follow a diet as told by your doctor. You may need to avoid foods and drinks such as: ? Coffee and tea (with or without caffeine). ? Drinks that contain alcohol. ? Energy drinks and sports drinks. ? Carbonated drinks or sodas. ? Chocolate and cocoa. ? Peppermint and mint flavorings. ? Garlic and onions. ? Horseradish. ? Spicy and acidic foods, such as peppers, chili powder, curry powder, vinegar, hot sauces, and BBQ sauce. ? Citrus fruit juices and citrus fruits, such as oranges, lemons, and limes. ? Tomato-based foods, such as red sauce, chili, salsa, and pizza with red sauce. ? Fried and fatty foods, such as donuts, french fries, potato chips, and high-fat dressings. ? High-fat meats, such as hot dogs, rib eye steak, sausage, ham, and bacon. ? High-fat dairy items, such as whole milk, butter, and cream cheese.  Eat small meals often. Avoid eating large meals.  Avoid drinking large amounts of liquid  with your meals.  Avoid eating meals during the 2-3 hours before bedtime.  Avoid lying down right after you eat.  Do not exercise right after you eat. General instructions  Pay attention to any changes in your symptoms.  Take over-the-counter and prescription medicines only as told by your doctor. Do not take aspirin, ibuprofen, or other NSAIDs unless your doctor says it is okay.  Do not use any tobacco products, including cigarettes, chewing tobacco, and e-cigarettes. If you need help quitting, ask your doctor.  Wear loose clothes. Do not wear anything tight around your waist.  Raise (elevate) the head of your bed about 6 inches (15 cm).  Try to lower your stress. If you need help doing this, ask your doctor.  If you are overweight, lose an amount of weight that is healthy for you. Ask your doctor about a safe weight loss goal.  Keep all follow-up visits as told by your doctor. This is important. Contact a doctor if:  You have new symptoms.  You lose weight and you do not know why it is happening.  You have trouble swallowing, or it hurts to swallow.  You have wheezing or a cough that keeps happening.  Your symptoms do not get better with treatment.  You have heartburn often for more than two weeks. Get help right away if:  You have pain in your arms, neck, jaw, teeth, or back.  You feel sweaty, dizzy, or light-headed.  You have chest pain or shortness of breath.  You  throw up (vomit) and your throw up looks like blood or coffee grounds.  Your poop (stool) is bloody or black. This information is not intended to replace advice given to you by your health care provider. Make sure you discuss any questions you have with your health care provider. Document Released: 10/13/2010 Document Revised: 07/09/2015 Document Reviewed: 05/28/2014 Elsevier Interactive Patient Education  [employer].

## 2017-01-10 DIAGNOSIS — M84374A Stress fracture, right foot, initial encounter for fracture: Secondary | ICD-10-CM | POA: Diagnosis not present

## 2017-01-10 DIAGNOSIS — E559 Vitamin D deficiency, unspecified: Secondary | ICD-10-CM | POA: Diagnosis not present

## 2017-01-10 DIAGNOSIS — T148XXA Other injury of unspecified body region, initial encounter: Secondary | ICD-10-CM | POA: Diagnosis not present

## 2017-01-10 DIAGNOSIS — B351 Tinea unguium: Secondary | ICD-10-CM | POA: Diagnosis not present

## 2017-01-12 DIAGNOSIS — Z96652 Presence of left artificial knee joint: Secondary | ICD-10-CM | POA: Diagnosis not present

## 2017-01-12 DIAGNOSIS — M84374A Stress fracture, right foot, initial encounter for fracture: Secondary | ICD-10-CM | POA: Diagnosis not present

## 2017-01-16 DIAGNOSIS — Z780 Asymptomatic menopausal state: Secondary | ICD-10-CM | POA: Diagnosis not present

## 2017-01-16 DIAGNOSIS — M85852 Other specified disorders of bone density and structure, left thigh: Secondary | ICD-10-CM | POA: Diagnosis not present

## 2017-01-17 DIAGNOSIS — M84374D Stress fracture, right foot, subsequent encounter for fracture with routine healing: Secondary | ICD-10-CM | POA: Diagnosis not present

## 2017-01-17 DIAGNOSIS — E559 Vitamin D deficiency, unspecified: Secondary | ICD-10-CM | POA: Diagnosis not present

## 2017-01-19 ENCOUNTER — Telehealth: Payer: Self-pay | Admitting: Family Medicine

## 2017-01-19 NOTE — Telephone Encounter (Signed)
Copied from [place] [id number]. Topic: Quick Communication - See Telephone Encounter >> Jan 19, 2017 12:00 PM Arletha Grippe wrote: CRM for notification. See Telephone encounter for:   01/19/17. Pt went to foot doctor and they did labs, her vitamin d is 38.2  She is not if results were faxed over.  She is wanting to know if she needs to if she needs to be on higher dose of vit.  She found out that she has 3 fractures in the top of her foot.  Cb number is [phone]

## 2017-01-19 NOTE — Telephone Encounter (Signed)
I called the pt and informed her of the message below

## 2017-01-19 NOTE — Telephone Encounter (Signed)
That is in the normal range.  Would recommend that she continue ever she is currently taking.  Most people need about 1000 international units daily during the winter.

## 2017-01-26 DIAGNOSIS — M8580 Other specified disorders of bone density and structure, unspecified site: Secondary | ICD-10-CM | POA: Diagnosis not present

## 2017-01-26 LAB — HM DEXA SCAN

## 2017-01-30 DIAGNOSIS — M545 Low back pain: Secondary | ICD-10-CM | POA: Diagnosis not present

## 2017-01-30 DIAGNOSIS — M25551 Pain in right hip: Secondary | ICD-10-CM | POA: Diagnosis not present

## 2017-02-21 ENCOUNTER — Ambulatory Visit (INDEPENDENT_AMBULATORY_CARE_PROVIDER_SITE_OTHER): Payer: BLUE CROSS/BLUE SHIELD | Admitting: Gastroenterology

## 2017-02-21 ENCOUNTER — Encounter: Payer: Self-pay | Admitting: Gastroenterology

## 2017-02-21 VITALS — BP 100/60 | HR 80 | Ht 65.5 in | Wt 198.0 lb

## 2017-02-21 DIAGNOSIS — K219 Gastro-esophageal reflux disease without esophagitis: Secondary | ICD-10-CM | POA: Insufficient documentation

## 2017-02-21 DIAGNOSIS — Z800 Family history of malignant neoplasm of digestive organs: Secondary | ICD-10-CM

## 2017-02-21 MED ORDER — PEG-KCL-NACL-NASULF-NA ASC-C 140 G PO SOLR: 1.0000 | Freq: Once | ORAL | 0 refills | Status: AC

## 2017-02-21 NOTE — Patient Instructions (Addendum)
If you are age 62 or older, your body mass index should be between 23-30. Your Body mass index is 32.45 kg/m. If this is out of the aforementioned range listed, please consider follow up with your Primary Care Provider.  If you are age 30 or younger, your body mass index should be between 19-25. Your Body mass index is 32.45 kg/m. If this is out of the aformentioned range listed, please consider follow up with your Primary Care Provider.   You have been scheduled for an endoscopy and colonoscopy. Please follow the written instructions given to you at your visit today. Please pick up your prep supplies at the pharmacy within the next 1-3 days. If you use inhalers (even only as needed), please bring them with you on the day of your procedure. Your physician has requested that you go to www.startemmi.com and enter the access code given to you at your visit today. This web site gives a general overview about your procedure. However, you should still follow specific instructions given to you by our office regarding your preparation for the procedure.  Thank you for entrusting me with your care and for [facility], Dr. Garrison Cellar

## 2017-02-21 NOTE — Progress Notes (Signed)
HPI :  62 y/o female with a history of GERD, arthritis, family history of colon cancer, referred by Dr. Colin Benton for a new patient consult regarding GERD and colon cancer screening.  She reports she has had intermittent reflux symptoms for the past several years. She states they come and go although she thinks getting worse in recent months. Symptoms are mainly pyrosis and regurgitation. She denies any dysphagia. She has some nausea at times but does not vomit. She denies any abdominal pains. She does have some chronic cough and wonders whether it is related to her reflux. She had been using mostly Tums as needed over the years which did not help too much. She was recently started on omeprazole 20 mg once daily and she states this is significantly helped her symptoms. She denies much breakthrough while on this regimen, although states if she misses a dose or tries to come off the omeprazole she'll have recurrence of symptoms.  Her father had colon cancer diagnosed she thinks that some time in his 95s although she is not sure. Her paternal aunt was diagnosed with colon cancer in her 28s. She has never had a prior colonoscopy. She denies any persistent bowel changes, her stool frequency can fluctuate. She denies any blood in her stools. She does endorse having negative stool testing for occult blood in the past.    Past Medical History:  Diagnosis Date  . Anxiety   . Arthritis   . GERD (gastroesophageal reflux disease)   . History of chicken pox   . Osteopenia      Past Surgical History:  Procedure Laterality Date  . CESAREAN SECTION     x2-1983, 1984  . REPLACEMENT TOTAL KNEE Left   . TONSILLECTOMY    . TOTAL HIP ARTHROPLASTY Right    Family History  Problem Relation Age of Onset  . Diabetes Father   . Arthritis Father   . Prostate cancer Father   . Colon cancer Father   . Cirrhosis Father   . Heart disease Paternal Grandmother   . Other Paternal Grandmother        brian  tumor  . Heart disease Paternal Grandfather   . Crohn's disease Sister        1/2 sister  . Colon cancer Paternal Aunt    Social History   Tobacco Use  . Smoking status: Former Research scientist (life sciences)  . Smokeless tobacco: Never Used  Substance Use Topics  . Alcohol use: Yes    Alcohol/week: 0.0 oz    Comment: 1-2 drinks daily   . Drug use: No   Current Outpatient Medications  Medication Sig Dispense Refill  . Ascorbic Acid (VITAMIN C PO) Take 1 tablet by mouth daily.     Marland Kitchen aspirin EC 81 MG tablet Take 81 mg daily by mouth.    . calcium carbonate (OS-CAL) 600 MG TABS tablet Take 600 mg by mouth daily.    . Cholecalciferol (VITAMIN D3) 1000 units CAPS Take 1 capsule by mouth daily.    Marland Kitchen ibuprofen (ADVIL,MOTRIN) 200 MG tablet Take 200 mg by mouth every 6 (six) hours as needed.    . Multiple Vitamins-Minerals (ALIVE WOMENS 50+ PO) Take by mouth daily.    Marland Kitchen omeprazole (PRILOSEC) 40 MG capsule Take 40 mg by mouth daily.     No current facility-administered medications for this visit.    Allergies  Allergen Reactions  . Other     Bee stings  . Amoxicillin Rash  Review of Systems: All systems reviewed and negative except where noted in HPI.   Lab Results  Component Value Date   WBC 8.0 03/24/2016   HGB 13.2 06/20/2016   HCT 43.9 03/24/2016   MCV 95.3 03/24/2016   PLT 253.0 03/24/2016    Lab Results  Component Value Date   CREATININE 0.79 03/24/2016   BUN 17 03/24/2016   NA 141 03/24/2016   K 4.0 03/24/2016   CL 104 03/24/2016   CO2 31 03/24/2016    No results found for: ALT, AST, GGT, ALKPHOS, BILITOT   Physical Exam: BP 100/60   Pulse 80   Ht 5' 5.5" (1.664 m)   Wt 198 lb (89.8 kg)   LMP 09/02/2013   BMI 32.45 kg/m  Constitutional: Pleasant,well-developed, female in no acute distress. HEENT: Normocephalic and atraumatic. Conjunctivae are normal. No scleral icterus. Neck supple.  Cardiovascular: Normal rate, regular rhythm.  Pulmonary/chest: Effort normal and  breath sounds normal. No wheezing, rales or rhonchi. Abdominal: Soft, nondistended, nontender.  There are no masses palpable. No hepatomegaly. Extremities: no edema Lymphadenopathy: No cervical adenopathy noted. Neurological: Alert and oriented to person place and time. Skin: Skin is warm and dry. No rashes noted. Psychiatric: Normal mood and affect. Behavior is normal.   ASSESSMENT AND PLAN: 63 year old female here for new patient visit regarding following issues:  GERD - long-standing symptoms, previously using over-the-counter remedies as needed, now on omeprazole with much better control of symptoms. I discussed reflux with her in general and management options. I discussed the long-term risks associated with PPI use. She feels much better on the low dose omeprazole than off, she wishes to continue at present dosing for now. I discussed the role of endoscopy with her for Barrett's screening. This is generally less common in females however with her duration of symptoms I offered her an exam. Given she is being sedated for colonoscopy she wanted to proceed with endoscopy at same time.  Family history of colon cancer - negative stool occult blood testing in the past however no optical colonoscopy. Given her family history of colon cancer and optical colonoscopy is recommended. I discussed the risks and benefits of colonoscopy and anesthesia with her, she wished proceed.  [place] Cellar, MD [facility] Pager [phone]  CC: Lucretia Kern, DO

## 2017-02-28 ENCOUNTER — Telehealth: Payer: Self-pay | Admitting: Gastroenterology

## 2017-02-28 NOTE — Telephone Encounter (Signed)
Spoke to pt. She would like to switch to Miralax split dose due to cost and will come in next week on the 22nd to pick up and go through new instructions. New instructions printed.

## 2017-02-28 NOTE — Telephone Encounter (Signed)
Called pt but could not leave a message b/c her voice mailbox is full.

## 2017-02-28 NOTE — Telephone Encounter (Signed)
Patient can switch to Miralax split dose but will need to come in to be re-instructed.

## 2017-03-23 ENCOUNTER — Encounter: Payer: Self-pay | Admitting: Gastroenterology

## 2017-04-06 ENCOUNTER — Ambulatory Visit (AMBULATORY_SURGERY_CENTER): Payer: BLUE CROSS/BLUE SHIELD | Admitting: Gastroenterology

## 2017-04-06 ENCOUNTER — Telehealth: Payer: Self-pay | Admitting: Gastroenterology

## 2017-04-06 ENCOUNTER — Encounter: Payer: Self-pay | Admitting: Gastroenterology

## 2017-04-06 ENCOUNTER — Other Ambulatory Visit: Payer: Self-pay

## 2017-04-06 VITALS — BP 135/78 | HR 80 | Temp 99.1°F | Resp 13 | Ht 65.5 in | Wt 198.0 lb

## 2017-04-06 DIAGNOSIS — K317 Polyp of stomach and duodenum: Secondary | ICD-10-CM | POA: Diagnosis not present

## 2017-04-06 DIAGNOSIS — Z800 Family history of malignant neoplasm of digestive organs: Secondary | ICD-10-CM | POA: Diagnosis not present

## 2017-04-06 DIAGNOSIS — K635 Polyp of colon: Secondary | ICD-10-CM

## 2017-04-06 DIAGNOSIS — Z1211 Encounter for screening for malignant neoplasm of colon: Secondary | ICD-10-CM | POA: Diagnosis present

## 2017-04-06 DIAGNOSIS — K219 Gastro-esophageal reflux disease without esophagitis: Secondary | ICD-10-CM | POA: Diagnosis not present

## 2017-04-06 DIAGNOSIS — D122 Benign neoplasm of ascending colon: Secondary | ICD-10-CM

## 2017-04-06 MED ORDER — SODIUM CHLORIDE 0.9 % IV SOLN: 500.0000 mL | Freq: Once | INTRAVENOUS | Status: AC

## 2017-04-06 NOTE — Progress Notes (Signed)
Report to PACU, RN, vss, BBS= Clear.  

## 2017-04-06 NOTE — Patient Instructions (Signed)
INFORMATION ON POLYPS AND DIVERTICULOSIS GIVEN TO YOU TODAY  AWAIT PATHOLOGY RESULTS IN A LETTER FROM DR ARMBRUSTER   YOU HAD AN ENDOSCOPIC PROCEDURE TODAY AT [facility]:   Refer to the procedure report that was given to you for any specific questions about what was found during the examination.  If the procedure report does not answer your questions, please call your gastroenterologist to clarify.  If you requested that your care partner not be given the details of your procedure findings, then the procedure report has been included in a sealed envelope for you to review at your convenience later.  YOU SHOULD EXPECT: Some feelings of bloating in the abdomen. Passage of more gas than usual.  Walking can help get rid of the air that was put into your GI tract during the procedure and reduce the bloating. If you had a lower endoscopy (such as a colonoscopy or flexible sigmoidoscopy) you may notice spotting of blood in your stool or on the toilet paper. If you underwent a bowel prep for your procedure, you may not have a normal bowel movement for a few days.  Please Note:  You might notice some irritation and congestion in your nose or some drainage.  This is from the oxygen used during your procedure.  There is no need for concern and it should clear up in a day or so.  SYMPTOMS TO REPORT IMMEDIATELY:   Following lower endoscopy (colonoscopy or flexible sigmoidoscopy):  Excessive amounts of blood in the stool  Significant tenderness or worsening of abdominal pains  Swelling of the abdomen that is new, acute  Fever of 100F or higher   Following upper endoscopy (EGD)  Vomiting of blood or coffee ground material  New chest pain or pain under the shoulder blades  Painful or persistently difficult swallowing  New shortness of breath  Fever of 100F or higher  Black, tarry-looking stools  For urgent or emergent issues, a gastroenterologist can be reached at any hour by  calling [phone].   DIET:  We do recommend a small meal at first, but then you may proceed to your regular diet.  Drink plenty of fluids but you should avoid alcoholic beverages for 24 hours.  ACTIVITY:  You should plan to take it easy for the rest of today and you should NOT DRIVE or use heavy machinery until tomorrow (because of the sedation medicines used during the test).    FOLLOW UP: Our staff will call the number listed on your records the next business day following your procedure to check on you and address any questions or concerns that you may have regarding the information given to you following your procedure. If we do not reach you, we will leave a message.  However, if you are feeling well and you are not experiencing any problems, there is no need to return our call.  We will assume that you have returned to your regular daily activities without incident.  If any biopsies were taken you will be contacted by phone or by letter within the next 1-3 weeks.  Please call us at [phone] if you have not heard about the biopsies in 3 weeks.    SIGNATURES/CONFIDENTIALITY: You and/or your care partner have signed paperwork which will be entered into your electronic medical record.  These signatures attest to the fact that that the information above on your After Visit Summary has been reviewed and is understood.  Full responsibility of the confidentiality  of this discharge information lies with you and/or your care-partner.

## 2017-04-06 NOTE — Progress Notes (Signed)
Called to room to assist during endoscopic procedure.  Patient ID and intended procedure confirmed with present staff. Received instructions for my participation in the procedure from the performing physician.  

## 2017-04-06 NOTE — Op Note (Signed)
[facility] Patient Name: Patricia Burns Procedure Date: 04/06/2017 11:11 AM MRN: [id number] Endoscopist: Remo Lipps P. Patricia Baltzell MD, MD Age: 62 Referring MD:  Date of Birth: [birth year] Gender: Female Account #: [id number] Procedure:                Upper GI endoscopy Indications:              history of reflux, screening for Barrett's esophagus Medicines:                Monitored Anesthesia Care Procedure:                Pre-Anesthesia Assessment:                           - Prior to the procedure, a History and Physical                            was performed, and patient medications and                            allergies were reviewed. The patient's tolerance of                            previous anesthesia was also reviewed. The risks                            and benefits of the procedure and the sedation                            options and risks were discussed with the patient.                            All questions were answered, and informed consent                            was obtained. Prior Anticoagulants: The patient has                            taken no previous anticoagulant or antiplatelet                            agents. ASA Grade Assessment: II - A patient with                            mild systemic disease. After reviewing the risks                            and benefits, the patient was deemed in                            satisfactory condition to undergo the procedure.                           After obtaining informed consent, the endoscope was  passed under direct vision. Throughout the                            procedure, the patient's blood pressure, pulse, and                            oxygen saturations were monitored continuously. The                            Endoscope was introduced through the mouth, and                            advanced to the second part of duodenum. The upper      GI endoscopy was accomplished without difficulty.                            The patient tolerated the procedure well. Scope In: Scope Out: Findings:                 Esophagogastric landmarks were identified: the                            Z-line was found at 40 cm, the gastroesophageal                            junction was found at 40 cm and the upper extent of                            the gastric folds was found at 40 cm from the                            incisors.                           The exam of the esophagus was otherwise normal.                           Multiple medium flat polyps were found in the                            gastric body. Biopsies were taken with a cold                            forceps for histology of a few of them as a                            representative sample.                           The exam of the stomach was otherwise normal.                           The duodenal bulb and second portion of the  duodenum were normal. Complications:            No immediate complications. Estimated blood loss:                            Minimal. Estimated Blood Loss:     Estimated blood loss was minimal. Impression:               - Esophagogastric landmarks identified.                           - Normal esophagus - no evidence of Barrett's                            esophagus                           - Multiple benign appearing gastric polyps.                            Biopsied.                           - Normal duodenal bulb and second portion of the                            duodenum. Recommendation:           - Patient has a contact number available for                            emergencies. The signs and symptoms of potential                            delayed complications were discussed with the                            patient. Return to normal activities tomorrow.                            Written discharge  instructions were provided to the                            patient.                           - Resume previous diet.                           - Continue present medications.                           - Await pathology results. Remo Lipps P. Patricia Funk MD, MD 04/06/2017 12:06:14 PM This report has been signed electronically.

## 2017-04-06 NOTE — Telephone Encounter (Signed)
I spoke with the patient, and she stated that she was still going to the bathroom and unsure if she should come.   I told her to put a towel under her and bring a spare pair of undies and pants. She agreed.

## 2017-04-06 NOTE — Progress Notes (Signed)
Pt's states no medical or surgical changes since previsit or office visit. 

## 2017-04-06 NOTE — Op Note (Signed)
[facility] Patient Name: Patricia Burns Procedure Date: 04/06/2017 11:11 AM MRN: [id number] Endoscopist: Remo Lipps P. Armbruster MD, MD Age: 62 Referring MD:  Date of Birth: [birth year] Gender: Female Account #: [id number] Procedure:                Colonoscopy Indications:              Screening patient at increased risk: Family history                            of 1st-degree relative with colorectal cancer                            (father diagnosed age 73s), This is the patient's                            first colonoscopy Medicines:                Monitored Anesthesia Care Procedure:                Pre-Anesthesia Assessment:                           - Prior to the procedure, a History and Physical                            was performed, and patient medications and                            allergies were reviewed. The patient's tolerance of                            previous anesthesia was also reviewed. The risks                            and benefits of the procedure and the sedation                            options and risks were discussed with the patient.                            All questions were answered, and informed consent                            was obtained. Prior Anticoagulants: The patient has                            taken no previous anticoagulant or antiplatelet                            agents. ASA Grade Assessment: II - A patient with                            mild systemic disease. After reviewing the risks  and benefits, the patient was deemed in                            satisfactory condition to undergo the procedure.                           After obtaining informed consent, the colonoscope                            was passed under direct vision. Throughout the                            procedure, the patient's blood pressure, pulse, and                            oxygen saturations were monitored  continuously. The                            Colonoscope was introduced through the anus and                            advanced to the the cecum, identified by                            appendiceal orifice and ileocecal valve. The                            colonoscopy was performed without difficulty. The                            patient tolerated the procedure well. The quality                            of the bowel preparation was adequate. The                            ileocecal valve, appendiceal orifice, and rectum                            were photographed. Scope In: 11:37:14 AM Scope Out: 11:55:46 AM Scope Withdrawal Time: 0 hours 14 minutes 0 seconds  Total Procedure Duration: 0 hours 18 minutes 32 seconds  Findings:                 The perianal and digital rectal examinations were                            normal.                           A diminutive polyp was found in the ascending                            colon. The polyp was sessile. The polyp was removed  with a cold snare. Resection and retrieval were                            complete.                           Multiple medium-mouthed diverticula were found in                            the sigmoid colon.                           The exam was otherwise without abnormality on                            direct and retroflexion views. Complications:            No immediate complications. Estimated blood loss:                            Minimal. Estimated Blood Loss:     Estimated blood loss was minimal. Impression:               - One diminutive polyp in the ascending colon,                            removed with a cold snare. Resected and retrieved.                           - Diverticulosis in the sigmoid colon.                           - The examination was otherwise normal on direct                            and retroflexion views. Recommendation:           - Patient has a  contact number available for                            emergencies. The signs and symptoms of potential                            delayed complications were discussed with the                            patient. Return to normal activities tomorrow.                            Written discharge instructions were provided to the                            patient.                           - Resume previous diet.                           -  Continue present medications.                           - Await pathology results.                           - Repeat colonoscopy is recommended for                            surveillance. The colonoscopy date will be                            determined after pathology results from today's                            exam become available for review. Remo Lipps P. Armbruster MD, MD 04/06/2017 12:02:37 PM This report has been signed electronically.

## 2017-04-07 ENCOUNTER — Telehealth: Payer: Self-pay

## 2017-04-07 NOTE — Telephone Encounter (Signed)
  Follow up Call-  Call back number 04/06/2017  Post procedure Call Back phone  # [phone]  Permission to leave phone message Yes  Some recent data might be hidden     Patient questions:  Do you have a fever, pain , or abdominal swelling? No. Pain Score  0 *  Have you tolerated food without any problems? Yes.    Have you been able to return to your normal activities? Yes.    Do you have any questions about your discharge instructions: Diet   No. Medications  No. Follow up visit  No.  Do you have questions or concerns about your Care? No.  Actions: * If pain score is 4 or above: No action needed, pain <4.

## 2017-04-13 ENCOUNTER — Encounter: Payer: Self-pay | Admitting: Gastroenterology

## 2017-06-21 DIAGNOSIS — Z130 Encounter for screening for diseases of the blood and blood-forming organs and certain disorders involving the immune mechanism: Secondary | ICD-10-CM | POA: Diagnosis not present

## 2017-06-21 DIAGNOSIS — Z1389 Encounter for screening for other disorder: Secondary | ICD-10-CM | POA: Diagnosis not present

## 2017-06-21 DIAGNOSIS — Z124 Encounter for screening for malignant neoplasm of cervix: Secondary | ICD-10-CM | POA: Diagnosis not present

## 2017-06-21 DIAGNOSIS — Z1231 Encounter for screening mammogram for malignant neoplasm of breast: Secondary | ICD-10-CM | POA: Diagnosis not present

## 2017-06-21 DIAGNOSIS — Z01419 Encounter for gynecological examination (general) (routine) without abnormal findings: Secondary | ICD-10-CM | POA: Diagnosis not present

## 2017-08-15 ENCOUNTER — Encounter: Payer: Self-pay | Admitting: Family Medicine

## 2017-08-28 DIAGNOSIS — Z96652 Presence of left artificial knee joint: Secondary | ICD-10-CM | POA: Diagnosis not present

## 2017-08-28 DIAGNOSIS — Z96641 Presence of right artificial hip joint: Secondary | ICD-10-CM | POA: Diagnosis not present

## 2017-08-29 IMAGING — MR MR LUMBAR SPINE W/O CM
4 of 5 series · 22 of 48 positions shown · non-contrast
Comparison: None.

CLINICAL DATA: Low back and bilateral leg pain for several weeks.
No known injury.

EXAM:
MRI LUMBAR SPINE WITHOUT CONTRAST
TECHNIQUE: Multiplanar, multisequence MR imaging of the lumbar spine was
performed. No intravenous contrast was administered.

[Series 2: T2 · sagittal · 4.0mm · 0.42mm/px · 6 of 12 slices shown (1 of 2)]
[im 1/12]
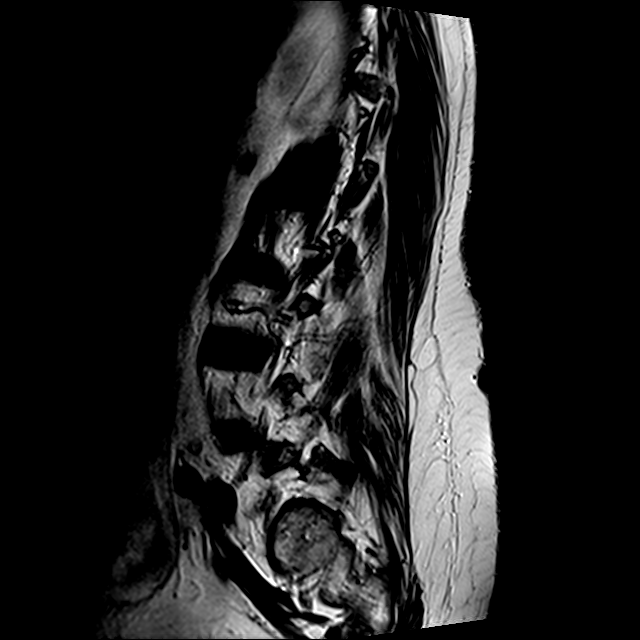
[im 3/12]
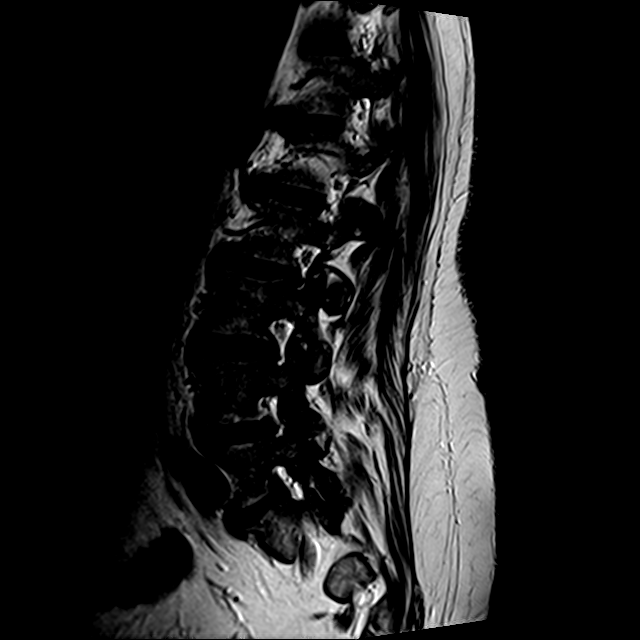
[im 5/12]
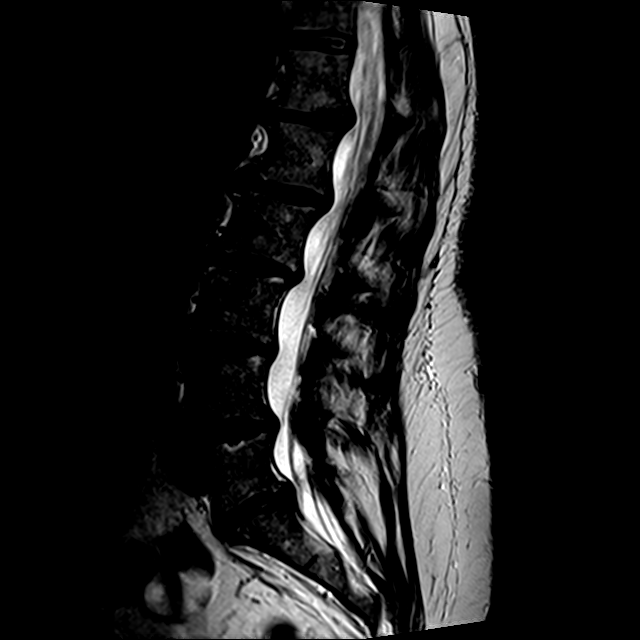
[im 7/12]
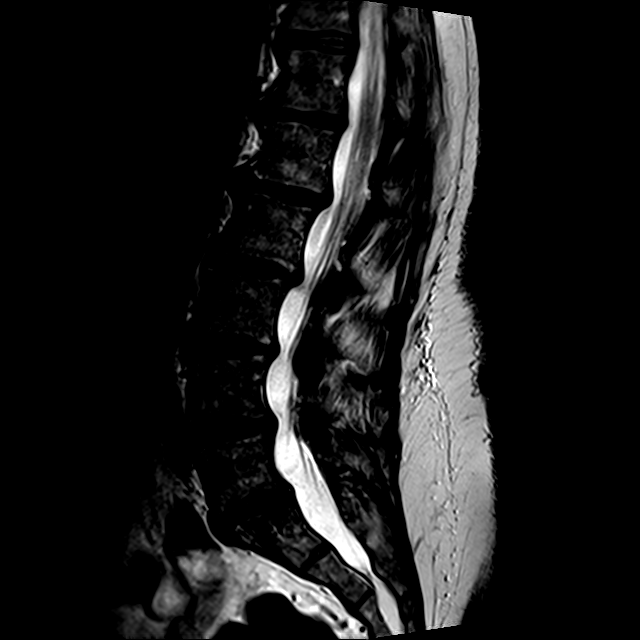
[im 9/12]
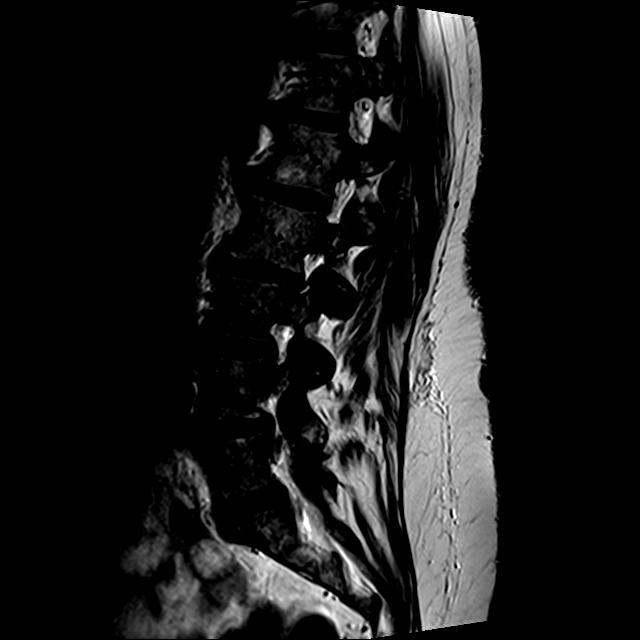
[im 12/12]
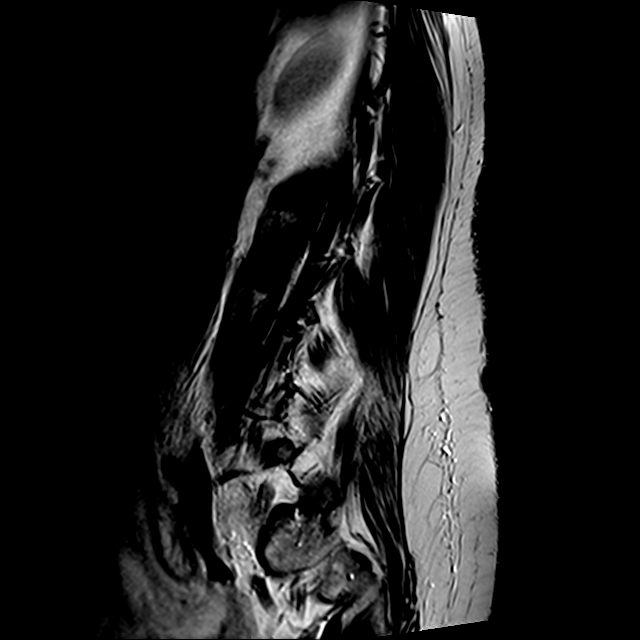

[Series 3: T1 · sagittal · 4.0mm · 0.55mm/px · 4 of 12 slices shown (1 of 2)]
[im 1/12]
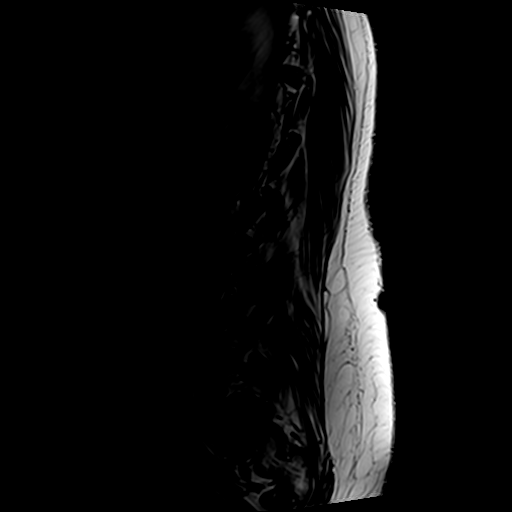
[im 3/12]
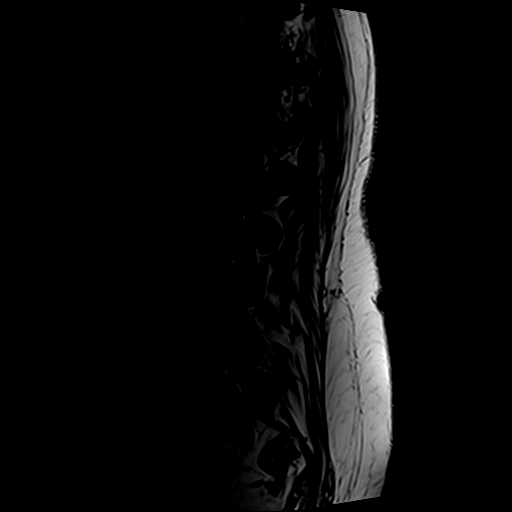
[im 7/12]
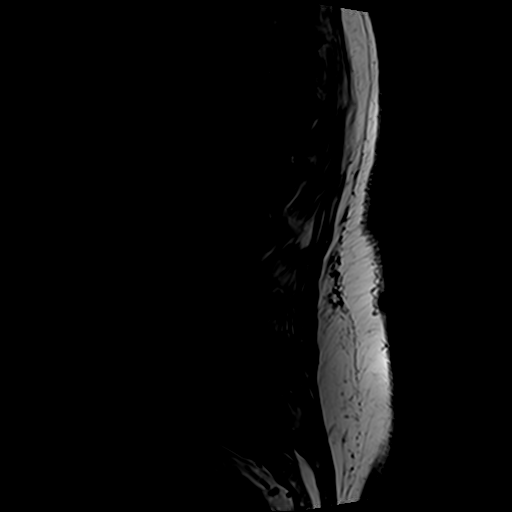
[im 12/12]
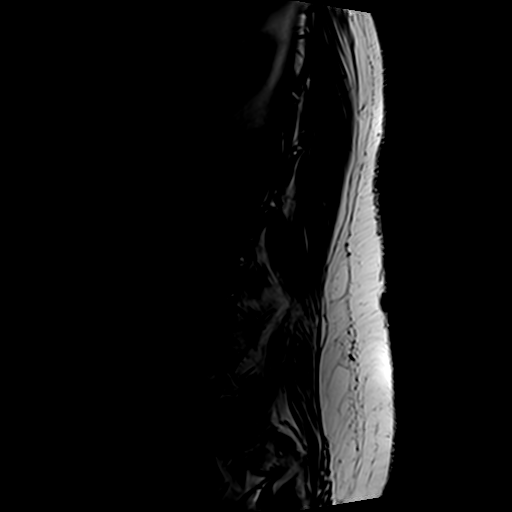

[Series 5: T1 · axial · 4.0mm · 0.37mm/px · z∈[-63,+68]mm · 3 of 30 slices shown (2 of 2)]
[im 5/30]
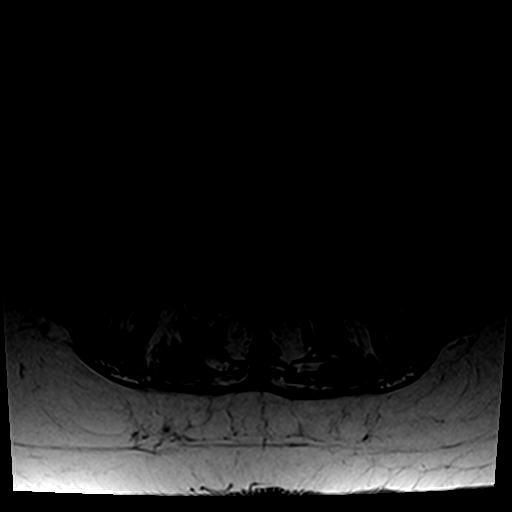
[im 15/30]
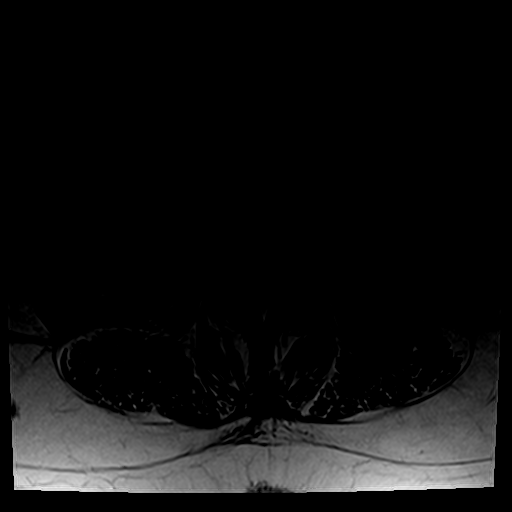
[im 25/30]
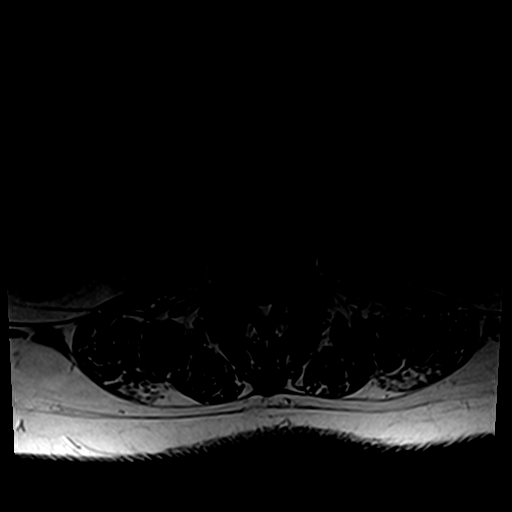

[Series 6: T2 · axial · 4.0mm · 0.74mm/px · z∈[-83,+118]mm · 9 of 30 slices shown (2 of 2)]
[im 1/30]
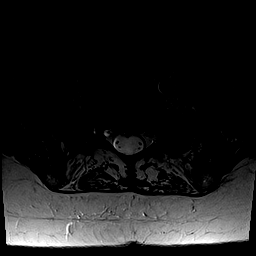
[im 5/30]
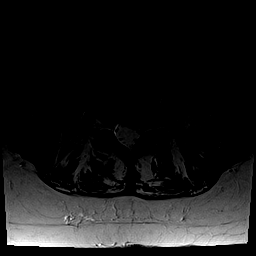
[im 9/30]
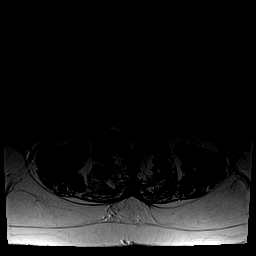
[im 13/30]
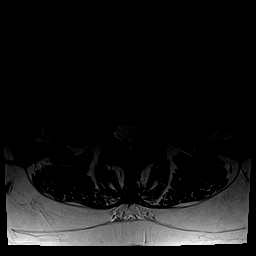
[im 15/30]
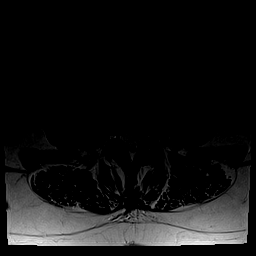
[im 17/30]
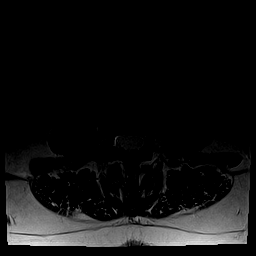
[im 21/30]
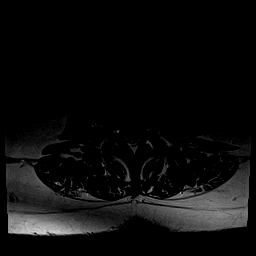
[im 25/30]
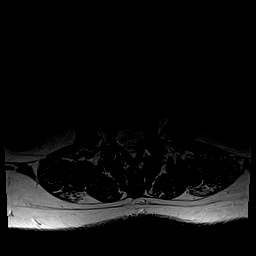
[im 30/30]
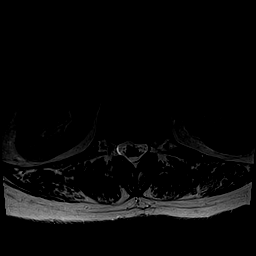

[22 of 48 positions shown; findings below may reference images not displayed]

FINDINGS: Segmentation:  Standard.

Alignment: Trace degenerative retrolisthesis L1 on L2 and L2 on L3
is identified. There is also trace anterolisthesis L5 on S1.

Vertebrae:  Height and signal are maintained.

Conus medullaris: Extends to the L1 level and appears normal.

Paraspinal and other soft tissues: Unremarkable.

Disc levels:

T11-12 is imaged in the sagittal plane only and negative.

T12-L1: Left paracentral protrusion without central canal or
foraminal stenosis.

L1-2: Shallow and broad-based left paracentral protrusion without
central canal or foraminal stenosis.

L2-3:  Mild disc bulge without central canal or foraminal narrowing.

L3-4:  Mild facet degenerative change.  Otherwise negative.

L4-5: Moderate to advanced bilateral facet degenerative disease is
identified. There is ligamentum flavum thickening and a shallow disc
bulge more prominent to the left. Moderate central canal stenosis is
seen. The foramina are open. No nerve root compression is
identified.

L5-S1: Left worse than right facet degenerative change. Shallow
broad-based left paracentral protrusion. The central canal and
foramina are open.
IMPRESSION: Spondylosis most notable at L4-5 where there is moderate central
canal narrowing due to a shallow broad-based disc bulge and
ligamentum flavum thickening. Moderate to advanced bilateral facet
degenerative change is present at this level.

Shallow broad-based left paracentral protrusion L5-S1 without
central canal stenosis. Left worse right facet degenerative changes
present at this level.

## 2018-01-16 ENCOUNTER — Ambulatory Visit (INDEPENDENT_AMBULATORY_CARE_PROVIDER_SITE_OTHER): Payer: BLUE CROSS/BLUE SHIELD

## 2018-01-16 ENCOUNTER — Encounter: Payer: Self-pay | Admitting: Family Medicine

## 2018-01-16 ENCOUNTER — Ambulatory Visit (INDEPENDENT_AMBULATORY_CARE_PROVIDER_SITE_OTHER): Payer: BLUE CROSS/BLUE SHIELD | Admitting: Family Medicine

## 2018-01-16 VITALS — BP 102/60 | HR 71 | Temp 98.5°F | Ht 65.5 in | Wt 196.9 lb

## 2018-01-16 DIAGNOSIS — Z23 Encounter for immunization: Secondary | ICD-10-CM

## 2018-01-16 DIAGNOSIS — K219 Gastro-esophageal reflux disease without esophagitis: Secondary | ICD-10-CM

## 2018-01-16 DIAGNOSIS — R0989 Other specified symptoms and signs involving the circulatory and respiratory systems: Secondary | ICD-10-CM | POA: Diagnosis not present

## 2018-01-16 DIAGNOSIS — R05 Cough: Secondary | ICD-10-CM

## 2018-01-16 DIAGNOSIS — J309 Allergic rhinitis, unspecified: Secondary | ICD-10-CM | POA: Diagnosis not present

## 2018-01-16 DIAGNOSIS — R198 Other specified symptoms and signs involving the digestive system and abdomen: Secondary | ICD-10-CM

## 2018-01-16 DIAGNOSIS — E669 Obesity, unspecified: Secondary | ICD-10-CM

## 2018-01-16 DIAGNOSIS — R053 Chronic cough: Secondary | ICD-10-CM

## 2018-01-16 MED ORDER — FLUTICASONE PROPIONATE 50 MCG/ACT NA SUSP: 1.0000 | Freq: Every day | NASAL | 6 refills | Status: AC

## 2018-01-16 NOTE — Progress Notes (Signed)
HPI:  Using dictation device. Unfortunately this device frequently misinterprets words/phrases.  Patricia Burns is a pleasant 62 year old here for an acute visit for a cough. -x > 5 years -chronic cough, globus sensation, pnd, occ ? Wheeze -friend told her she should get a cxr every year and she if very worried now -remote hx of occ cig uses - a few cigs at parties, > 20 years ago and never regular use -no fevers, wt loss, malaise, sig sob, hemoptysis -taking ppi and gerd resolved as long as takes -hx allergies and sporadically uses allegra -no hx asthma or allergy use Past medical history is significant for acid reflux.  She has not been seen here in some time.  She saw the gastroenterologist in 2019 for the acid reflux.  She had EGD and colonoscopy.  Is on chronic PPI with GI.  No regular exercise. Wants to get her vaccines today - flu and tdap - around grandchildren.  ROS: See pertinent positives and negatives per HPI.  Past Medical History:  Diagnosis Date  . Anxiety   . Arthritis   . GERD (gastroesophageal reflux disease)   . History of chicken pox   . Osteopenia     Past Surgical History:  Procedure Laterality Date  . CESAREAN SECTION     x2-1983, 1984  . REPLACEMENT TOTAL KNEE Left   . TONSILLECTOMY    . TOTAL HIP ARTHROPLASTY Right     Family History  Problem Relation Age of Onset  . Diabetes Father   . Arthritis Father   . Prostate cancer Father   . Colon cancer Father   . Cirrhosis Father   . Heart disease Paternal Grandmother   . Other Paternal Grandmother        brian tumor  . Heart disease Paternal Grandfather   . Crohn's disease Sister        1/2 sister  . Colon cancer Paternal Aunt     SOCIAL HX: see hpi   Current Outpatient Medications:  .  Ascorbic Acid (VITAMIN C PO), Take 1 tablet by mouth daily. , Disp: , Rfl:  .  Cholecalciferol (VITAMIN D3) 1000 units CAPS, Take 1 capsule by mouth daily., Disp: , Rfl:  .  omeprazole (PRILOSEC) 40 MG  capsule, Take 40 mg by mouth daily., Disp: , Rfl:  .  fluticasone (FLONASE) 50 MCG/ACT nasal spray, Place 1 spray into both nostrils daily., Disp: 16 g, Rfl: 6  Current Facility-Administered Medications:  .  0.9 %  sodium chloride infusion, 500 mL, Intravenous, Once, Armbruster, Carlota Raspberry, MD  EXAM:  Vitals:   01/16/18 1017  BP: 102/60  Pulse: 71  Temp: 98.5 F (36.9 C)  SpO2: 98%    Body mass index is 32.27 kg/m.  GENERAL: vitals reviewed and listed above, alert, oriented, appears well hydrated and in no acute distress  HEENT: atraumatic, conjunttiva clear, no obvious abnormalities on inspection of external nose and ears, normal appearance of ear canals and TMs, clear nasal congestion, mild post oropharyngeal erythema with PND, no tonsillar edema or exudate, no sinus TTP  NECK: no obvious masses on inspection  LUNGS: clear to auscultation bilaterally, no wheezes, rales or rhonchi, good air movement  CV: HRRR, no peripheral edema  MS: moves all extremities without noticeable abnormality  PSYCH: pleasant and cooperative, no obvious depression or anxiety  ASSESSMENT AND PLAN:  Discussed the following assessment and plan:  Chronic cough - Plan: DG Chest 2 View Allergic rhinitis, unspecified seasonality, unspecified trigger Globus sensation  Gastroesophageal reflux disease, esophagitis presence not specified -we discussed possible serious and likely etiologies, workup and treatment, treatment risks and return precautions for chronic cough.  Has a number of issues that may be contributing, gas drink reflux and allergic rhinitis with postnasal drip. -after this discussion, Patricia Burns opted for chest x-ray today, start trial of INS and nightly Zyrtec. -follow up advised 1 month, if not improving will send her to pulmonology for an evaluation given her significant concern about this -of course, we advised Patricia Burns  to return or notify a doctor immediately if symptoms worsen or persist or new  concerns arise.  Obesity (BMI 30.0-34.9) -Lifestyle recommendations, see handout  Vaccines today per orders per her request.  Discussed risk and benefits.  -Patient advised to return or notify a doctor immediately if symptoms worsen or persist or new concerns arise.  Patient Instructions  BEFORE YOU LEAVE: -update yearly phq9 -Tdap and flu shots -xray -follow up: 1 month  Start flonase 2 sprays each nostril daily x 1 month, then one spray each nostril daily.  Zyrtec once daily at night. Available over the counter.  Start to gradually increase exercise - goal of 150 minute per week.  Eat a healthy low sugar diet.  We have ordered an xray at this visit. It can take up to 1-2 weeks for results and processing. IF results require follow up or explanation, we will call you with instructions. Clinically stable results will be released to your [facility]. If you have not heard from [place] or cannot find your results in [facility] in 2 weeks please contact our office at [phone].  If you are not yet signed up for [facility], please consider signing up.    We recommend the following healthy lifestyle for LIFE: 1) Small portions. But, make sure to get regular (at least 3 per day), healthy meals and small healthy snacks if needed.  2) Eat a healthy clean diet.   TRY TO EAT: -at least 5-7 servings of low sugar, colorful, and nutrient rich vegetables per day (not corn, potatoes or bananas.) -berries are the best choice if you wish to eat fruit (only eat small amounts if trying to reduce weight)  -lean meets (fish, white meat of chicken or [place]) -vegan proteins for some meals - beans or tofu, whole grains, nuts and seeds -Replace bad fats with good fats - good fats include: fish, nuts and seeds, canola oil, olive oil -small amounts of low fat or non fat dairy -small amounts of100 % whole grains - check the lables -drink plenty of water  AVOID: -SUGAR, sweets, anything with added sugar, corn  syrup or sweeteners - must read labels as even foods advertised as "healthy" often are loaded with sugar -if you must have a sweetener, small amounts of stevia may be best -sweetened beverages and artificially sweetened beverages -simple starches (rice, bread, potatoes, pasta, chips, etc - small amounts of 100% whole grains are ok) -red meat, pork, butter -fried foods, fast food, processed food, excessive dairy, eggs and coconut.  3)Get at least 150 minutes of sweaty aerobic exercise per week.  4)Reduce stress - consider counseling, meditation and relaxation to balance other aspects of your life.          Lucretia Kern, DO

## 2018-01-16 NOTE — Patient Instructions (Addendum)
BEFORE YOU LEAVE: -update yearly phq9 -Tdap and flu shots -xray -follow up: 1 month  Start flonase 2 sprays each nostril daily x 1 month, then one spray each nostril daily.  Zyrtec once daily at night. Available over the counter.  Start to gradually increase exercise - goal of 150 minute per week.  Eat a healthy low sugar diet.  We have ordered an xray at this visit. It can take up to 1-2 weeks for results and processing. IF results require follow up or explanation, we will call you with instructions. Clinically stable results will be released to your [facility]. If you have not heard from [place] or cannot find your results in [facility] in 2 weeks please contact our office at [phone].  If you are not yet signed up for [facility], please consider signing up.    We recommend the following healthy lifestyle for LIFE: 1) Small portions. But, make sure to get regular (at least 3 per day), healthy meals and small healthy snacks if needed.  2) Eat a healthy clean diet.   TRY TO EAT: -at least 5-7 servings of low sugar, colorful, and nutrient rich vegetables per day (not corn, potatoes or bananas.) -berries are the best choice if you wish to eat fruit (only eat small amounts if trying to reduce weight)  -lean meets (fish, white meat of chicken or [place]) -vegan proteins for some meals - beans or tofu, whole grains, nuts and seeds -Replace bad fats with good fats - good fats include: fish, nuts and seeds, canola oil, olive oil -small amounts of low fat or non fat dairy -small amounts of100 % whole grains - check the lables -drink plenty of water  AVOID: -SUGAR, sweets, anything with added sugar, corn syrup or sweeteners - must read labels as even foods advertised as "healthy" often are loaded with sugar -if you must have a sweetener, small amounts of stevia may be best -sweetened beverages and artificially sweetened beverages -simple starches (rice, bread, potatoes, pasta, chips, etc - small  amounts of 100% whole grains are ok) -red meat, pork, butter -fried foods, fast food, processed food, excessive dairy, eggs and coconut.  3)Get at least 150 minutes of sweaty aerobic exercise per week.  4)Reduce stress - consider counseling, meditation and relaxation to balance other aspects of your life.

## 2018-01-16 NOTE — Addendum Note (Signed)
Addended by: Agnes Lawrence on: 01/16/2018 11:08 AM   Modules accepted: Orders

## 2018-02-15 NOTE — Progress Notes (Signed)
HPI:  Using dictation device. Unfortunately this device frequently misinterprets words/phrases.  Follow up cough: -chronic, > 5 years  - sees details from [place] 01/16/18 reviewed -PMH significant for PND, allergies and GERD -had reported EGD in 2019 -CXR 01/16/18  ? Bronchitis - referred to pulmonology - but she has not heard about this appt -did advise trial  INS and zyrtec last visit 1 month ago helps a little -today reports: not really much better, still coughing daily, reports never contacted by pulmonology -denies:sob, doe, fevers, hemoptysis  ROS: See pertinent positives and negatives per HPI.  Past Medical History:  Diagnosis Date  . Anxiety   . Arthritis   . GERD (gastroesophageal reflux disease)   . History of chicken pox   . Osteopenia     Past Surgical History:  Procedure Laterality Date  . CESAREAN SECTION     x2-1983, 1984  . REPLACEMENT TOTAL KNEE Left   . TONSILLECTOMY    . TOTAL HIP ARTHROPLASTY Right     Family History  Problem Relation Age of Onset  . Diabetes Father   . Arthritis Father   . Prostate cancer Father   . Colon cancer Father   . Cirrhosis Father   . Heart disease Paternal Grandmother   . Other Paternal Grandmother        brian tumor  . Heart disease Paternal Grandfather   . Crohn's disease Sister        1/2 sister  . Colon cancer Paternal Aunt     SOCIAL HX: see hpi   Current Outpatient Medications:  .  Ascorbic Acid (VITAMIN C PO), Take 1 tablet by mouth daily. , Disp: , Rfl:  .  Cholecalciferol (VITAMIN D3) 1000 units CAPS, Take 1 capsule by mouth daily., Disp: , Rfl:  .  fluticasone (FLONASE) 50 MCG/ACT nasal spray, Place 1 spray into both nostrils daily., Disp: 16 g, Rfl: 6 .  omeprazole (PRILOSEC) 40 MG capsule, Take 40 mg by mouth daily., Disp: , Rfl:   Current Facility-Administered Medications:  .  0.9 %  sodium chloride infusion, 500 mL, Intravenous, Once, Armbruster, Carlota Raspberry, MD  EXAM:  Vitals:   02/19/18 1102  BP:  100/62  Pulse: 66  Temp: 97.6 F (36.4 C)  SpO2: 98%    Body mass index is 32.61 kg/m.  GENERAL: vitals reviewed and listed above, alert, oriented, appears well hydrated and in no acute distress  HEENT: atraumatic, conjunttiva clear, no obvious abnormalities on inspection of external nose and ears  NECK: no obvious masses on inspection  LUNGS: clear to auscultation bilaterally, no wheezes, rales or rhonchi, good air movement  CV: HRRR, no peripheral edema  MS: moves all extremities without noticeable abnormality  PSYCH: pleasant and cooperative, no obvious depression or anxiety  ASSESSMENT AND PLAN:  Discussed the following assessment and plan:  Chronic cough - Plan: Ambulatory referral to Pulmonology  -we discussed possible serious and likely etiologies, workup and treatment, treatment risks and return precautions -after this discussion, Fannye opted for: -re-refer pulm; discussed with referral coordinator and she is going to check on status of referral. Cont ppi, INS in interim. -CPE in 1-3 months with labs then -Patient advised to return or notify a doctor immediately if symptoms worsen or persist or new concerns arise.  Patient Instructions  BEFORE YOU LEAVE: -follow up: CPE in 1-3  Months per patient preference - come fasting for labs  -We placed a referral again for you as discussed. It usually takes about 1-2  weeks to process and schedule this referral. If you have not heard from [place] regarding this appointment in 2 weeks please contact our office.    Lucretia Kern, DO

## 2018-02-19 ENCOUNTER — Encounter: Payer: Self-pay | Admitting: Family Medicine

## 2018-02-19 ENCOUNTER — Ambulatory Visit (INDEPENDENT_AMBULATORY_CARE_PROVIDER_SITE_OTHER): Payer: BLUE CROSS/BLUE SHIELD | Admitting: Family Medicine

## 2018-02-19 VITALS — BP 100/62 | HR 66 | Temp 97.6°F | Ht 65.5 in | Wt 199.0 lb

## 2018-02-19 DIAGNOSIS — R053 Chronic cough: Secondary | ICD-10-CM

## 2018-02-19 DIAGNOSIS — R05 Cough: Secondary | ICD-10-CM

## 2018-02-19 NOTE — Patient Instructions (Addendum)
BEFORE YOU LEAVE: -follow up: CPE in 1-3  Months per patient preference - come fasting for labs  -We placed a referral again for you as discussed. It usually takes about 1-2 weeks to process and schedule this referral. If you have not heard from [place] regarding this appointment in 2 weeks please contact our office.

## 2018-02-23 ENCOUNTER — Encounter: Payer: Self-pay | Admitting: Pulmonary Disease

## 2018-02-23 ENCOUNTER — Ambulatory Visit (INDEPENDENT_AMBULATORY_CARE_PROVIDER_SITE_OTHER): Payer: BLUE CROSS/BLUE SHIELD | Admitting: Pulmonary Disease

## 2018-02-23 ENCOUNTER — Telehealth: Payer: Self-pay

## 2018-02-23 VITALS — BP 118/70 | HR 77 | Ht 65.5 in | Wt 196.6 lb

## 2018-02-23 DIAGNOSIS — R05 Cough: Secondary | ICD-10-CM | POA: Diagnosis not present

## 2018-02-23 DIAGNOSIS — R0683 Snoring: Secondary | ICD-10-CM

## 2018-02-23 DIAGNOSIS — J310 Chronic rhinitis: Secondary | ICD-10-CM | POA: Diagnosis not present

## 2018-02-23 DIAGNOSIS — R059 Cough, unspecified: Secondary | ICD-10-CM

## 2018-02-23 NOTE — Patient Instructions (Addendum)
Thank you for visiting Dr. Valeta Harms at [facility]. Today we recommend the following:  Continue Flonase, Allegra, and Omeprazole.    Return in about 3 months (around 05/25/2018), or if symptoms worsen or fail to improve.

## 2018-02-23 NOTE — Progress Notes (Signed)
Synopsis: Referred in Jan 2020 for cough by Lucretia Kern, DO  Subjective:   PATIENT ID: Patricia Burns GENDER: female DOB: 06/26/1955, MRN: [id number]  Chief Complaint  Patient presents with  . Consult    Consult for cough. States she has had a cough for years. Intermittent mucous. States her sputum is sometimes so thick she gets choked on it.     PMH of GERD and seasonal allergies. She had EGD. Does had GERD, does take PPI daily. Has issues with sinus congestion and allergies. She thinks she has been coughing 5 years. 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 She was exposed to chemicals. Currently retired. She moved in with daughter and son-in-law. Grandkids sometimes make her sick. Her family is concerned because she coughs all of the time.  She does notice that she sleeps okay wakes up a couple times overnight.  She has noticed snoring has woke her up in the past.  She has been told by family members that she snores.  She was started on daily antihistamine, Allegra recently as well as Flonase.  This was only started about a week ago.  She may have noticed some improvement in her cough and congestion.  But when questioned deeper about her regular use of these medications she is like "I will get better and I will use them every day".  Her cough is nonproductive.  She does have a very remote smoking history as a teenager/college age she smoked a few cigarettes.  She has been told before to watch dietary intake for gastroesophageal reflux but does routinely have a glass of wine or chocolate.   Past Medical History:  Diagnosis Date  . Anxiety   . Arthritis   . GERD (gastroesophageal reflux disease)   . History of chicken pox   . Osteopenia      Family History  Problem Relation Age of Onset  . Diabetes Father   . Arthritis Father   . Prostate cancer Father   . Colon cancer Father   . Cirrhosis Father   . Heart disease Paternal Grandmother   . Other Paternal Grandmother        brian  tumor  . Heart disease Paternal Grandfather   . Crohn's disease Sister        1/2 sister  . Colon cancer Paternal Aunt      Past Surgical History:  Procedure Laterality Date  . CESAREAN SECTION     x2-1983, 1984  . REPLACEMENT TOTAL KNEE Left   . TONSILLECTOMY    . TOTAL HIP ARTHROPLASTY Right     Social History   Socioeconomic History  . Marital status: Divorced    Spouse name: Not on file  . Number of children: Not on file  . Years of education: Not on file  . Highest education level: Not on file  Occupational History  . Not on file  Social Needs  . Financial resource strain: Not on file  . Food insecurity:    Worry: Not on file    Inability: Not on file  . Transportation needs:    Medical: Not on file    Non-medical: Not on file  Tobacco Use  . Smoking status: Former Research scientist (life sciences)  . Smokeless tobacco: Never Used  Substance and Sexual Activity  . Alcohol use: Yes    Alcohol/week: 1.0 - 2.0 standard drinks    Types: 1 - 2 Glasses of wine per week    Comment: 1-2 drinks daily   .  Drug use: No  . Sexual activity: Not on file  Lifestyle  . Physical activity:    Days per week: Not on file    Minutes per session: Not on file  . Stress: Not on file  Relationships  . Social connections:    Talks on phone: Not on file    Gets together: Not on file    Attends religious service: Not on file    Active member of club or organization: Not on file    Attends meetings of clubs or organizations: Not on file    Relationship status: Not on file  . Intimate partner violence:    Fear of current or ex partner: Not on file    Emotionally abused: Not on file    Physically abused: Not on file    Forced sexual activity: Not on file  Other Topics Concern  . Not on file  Social History Narrative   Work or School: Acupuncturist Situation: lives with duaghter      Spiritual Beliefs: Christian      Lifestyle: no regular exercise; diet is ok              Allergies    Allergen Reactions  . Other     Bee stings  . Amoxicillin Rash     Outpatient Medications Prior to Visit  Medication Sig Dispense Refill  . Ascorbic Acid (VITAMIN C PO) Take 1 tablet by mouth daily.     . cetirizine (ZYRTEC) 10 MG tablet Take 10 mg by mouth daily.    . Cholecalciferol (VITAMIN D3) 1000 units CAPS Take 1 capsule by mouth daily.    . fluticasone (FLONASE) 50 MCG/ACT nasal spray Place 1 spray into both nostrils daily. 16 g 6  . omeprazole (PRILOSEC) 40 MG capsule Take 40 mg by mouth daily.     Facility-Administered Medications Prior to Visit  Medication Dose Route Frequency Provider Last Rate Last Dose  . 0.9 %  sodium chloride infusion  500 mL Intravenous Once Armbruster, Carlota Raspberry, MD        Review of Systems  Constitutional: Negative for chills, fever, malaise/fatigue and weight loss.  HENT: Negative for hearing loss, sore throat and tinnitus.   Eyes: Negative for blurred vision and double vision.  Respiratory: Positive for cough. Negative for hemoptysis, sputum production, shortness of breath, wheezing and stridor.   Cardiovascular: Negative for chest pain, palpitations, orthopnea, leg swelling and PND.  Gastrointestinal: Negative for abdominal pain, constipation, diarrhea, heartburn, nausea and vomiting.  Genitourinary: Negative for dysuria, hematuria and urgency.  Musculoskeletal: Negative for joint pain and myalgias.  Skin: Negative for itching and rash.  Neurological: Negative for dizziness, tingling, weakness and headaches.  Endo/Heme/Allergies: Negative for environmental allergies. Does not bruise/bleed easily.  Psychiatric/Behavioral: Negative for depression. The patient is not nervous/anxious and does not have insomnia.   All other systems reviewed and are negative.    Objective:  Physical Exam Vitals signs reviewed.  Constitutional:      General: She is not in acute distress.    Appearance: She is well-developed.  HENT:     Head: Normocephalic  and atraumatic.     Nose: Congestion and rhinorrhea present.     Comments: Bilateral turbinates are very red, erythematous    Mouth/Throat:     Comments: Mallampati 4  Eyes:     General: No scleral icterus.    Conjunctiva/sclera: Conjunctivae normal.     Pupils: Pupils are equal,  round, and reactive to light.  Neck:     Musculoskeletal: Neck supple.     Vascular: No JVD.     Trachea: No tracheal deviation.  Cardiovascular:     Rate and Rhythm: Normal rate and regular rhythm.     Heart sounds: Normal heart sounds. No murmur.  Pulmonary:     Effort: Pulmonary effort is normal. No tachypnea, accessory muscle usage or respiratory distress.     Breath sounds: Normal breath sounds. No stridor. No wheezing, rhonchi or rales.  Abdominal:     General: Bowel sounds are normal. There is no distension.     Palpations: Abdomen is soft.     Tenderness: There is no abdominal tenderness.  Musculoskeletal:        General: No tenderness.  Lymphadenopathy:     Cervical: No cervical adenopathy.  Skin:    General: Skin is warm and dry.     Capillary Refill: Capillary refill takes less than 2 seconds.     Findings: No rash.  Neurological:     Mental Status: She is alert and oriented to person, place, and time.  Psychiatric:        Behavior: Behavior normal.      Vitals:   02/23/18 1007  BP: 118/70  Pulse: 77  SpO2: 97%  Weight: 196 lb 9.6 oz (89.2 kg)  Height: 5' 5.5" (1.664 m)   97% on RA BMI Readings from Last 3 Encounters:  02/23/18 32.22 kg/m  02/19/18 32.61 kg/m  01/16/18 32.27 kg/m   Wt Readings from Last 3 Encounters:  02/23/18 196 lb 9.6 oz (89.2 kg)  02/19/18 199 lb (90.3 kg)  01/16/18 196 lb 14.4 oz (89.3 kg)     CBC    Component Value Date/Time   WBC 8.0 03/24/2016 1221   RBC 4.60 03/24/2016 1221   HGB 13.2 06/20/2016   HCT 43.9 03/24/2016 1221   PLT 253.0 03/24/2016 1221   MCV 95.3 03/24/2016 1221   MCHC 33.8 03/24/2016 1221   RDW 12.7 03/24/2016 1221     Chest Imaging: 01/16/2018: No active pulmonary disease The patient's images have been independently reviewed by me.    Pulmonary Functions Testing Results: No flowsheet data found.  FeNO: None   Pathology: None   Echocardiogram: None  Heart Catheterization: None     Assessment & Plan:   Cough  Chronic rhinitis  Snoring  Discussion:  This is a 63 year old female with a history of chronic cough.  This is proximal again on 5 years.  She does have some chemical cleaning exposure and a very remote smoking history at a young age only a few cigarettes here and there.  She does complain of significant upper nose chronic rhinitis and drainage.  She was only started recently on an antihistamine and intranasal steroid approximately week ago.  She is unsure if this is started to help or not but may be.  She also snores.  She has had apnea events and has been woken up by this in the past.  She is concerned she may have obstructive sleep apnea.  Recommend the following: We will set her up to see 1 of our sleep physicians for evaluation of possible sleep apnea. As for her cough I think we need to continue the current medications that were just started to see if they make any difference.  Most cough is caused by gastroesophageal reflux disease and posterior nasal drip.  We are treating both of these at this time with PPI,  antihistamine and intranasal steroid.  If she is still having ongoing cough symptoms I think it is reasonable to obtain pulmonary function test.  But would hold off at this time until she has tried treatment of reflux and PND symptoms. Patient can return in 3 months or if symptoms worsen.  She can follow-up with one of our nurse practitioners.  Greater than 50% of this patient's 45-minute office was been face-to-face discussing the recommendation treatment plan.   Current Outpatient Medications:  .  Ascorbic Acid (VITAMIN C PO), Take 1 tablet by mouth daily. , Disp: ,  Rfl:  .  cetirizine (ZYRTEC) 10 MG tablet, Take 10 mg by mouth daily., Disp: , Rfl:  .  Cholecalciferol (VITAMIN D3) 1000 units CAPS, Take 1 capsule by mouth daily., Disp: , Rfl:  .  fluticasone (FLONASE) 50 MCG/ACT nasal spray, Place 1 spray into both nostrils daily., Disp: 16 g, Rfl: 6 .  omeprazole (PRILOSEC) 40 MG capsule, Take 40 mg by mouth daily., Disp: , Rfl:   Current Facility-Administered Medications:  .  0.9 %  sodium chloride infusion, 500 mL, Intravenous, Once, Armbruster, Carlota Raspberry, MD   Garner Nash, DO [facility] 02/23/2018 10:30 AM

## 2018-02-23 NOTE — Telephone Encounter (Signed)
LMTCB. Please schedule patient a consult appointment with Dr. Ander Slade per [place].

## 2018-03-09 NOTE — Telephone Encounter (Signed)
Call made to patient, made aware we were trying to contact her regarding setting her up with a consult for sleep apnea. Patient states she does not think she has it and she will think about it and let us know at a later time. Nothing further is needed at this time.

## 2018-03-12 ENCOUNTER — Encounter: Payer: Self-pay | Admitting: Family Medicine

## 2018-03-12 ENCOUNTER — Ambulatory Visit (INDEPENDENT_AMBULATORY_CARE_PROVIDER_SITE_OTHER): Payer: BLUE CROSS/BLUE SHIELD | Admitting: Family Medicine

## 2018-03-12 VITALS — BP 108/60 | HR 68 | Temp 98.3°F | Ht 66.0 in | Wt 198.4 lb

## 2018-03-12 DIAGNOSIS — Z6832 Body mass index (BMI) 32.0-32.9, adult: Secondary | ICD-10-CM | POA: Diagnosis not present

## 2018-03-12 DIAGNOSIS — Z0000 Encounter for general adult medical examination without abnormal findings: Secondary | ICD-10-CM | POA: Diagnosis not present

## 2018-03-12 DIAGNOSIS — Z1159 Encounter for screening for other viral diseases: Secondary | ICD-10-CM | POA: Diagnosis not present

## 2018-03-12 LAB — LIPID PANEL
Cholesterol: 193 mg/dL (ref 0–200)
HDL: 50.6 mg/dL (ref >39.00)
LDL Cholesterol: 122 mg/dL — ABNORMAL HIGH (ref 0–99)
NONHDL: 142.44
Total CHOL/HDL Ratio: 4
Triglycerides: 103 mg/dL (ref 0.0–149.0)
VLDL: 20.6 mg/dL (ref 0.0–40.0)

## 2018-03-12 LAB — HEMOGLOBIN A1C: Hgb A1c MFr Bld: 5.5 % (ref 4.6–6.5)

## 2018-03-12 NOTE — Progress Notes (Signed)
HPI:  Using dictation device. Unfortunately this device frequently misinterprets words/phrases.  Here for CPE:  Concerns and/or follow up today:  Now seeing pulmonology for chronic cough.   -Diet: variety of foods, balance and well rounded -Exercise: some regular exercise -Taking folic acid, vitamin D or calcium: no -Diabetes and Dyslipidemia Screening: fasting for labs -Vaccines: see vaccine section EPIC -pap history: sees Dr. Daiva Huge - reports utd -FDLMP: see nursing notes -sexual activity: not discussed -wants STI testing (Hep C if born [birth year]): no - agrees to hep c screen though -FH breast, colon or ovarian ca: see FH Last mammogram: sees gyn Last colon cancer screening: 03/2017 Breast Ca Risk Assessment: see family history and pt history DEXA (>/= 62): sees gyn  -Alcohol, Tobacco, drug use: see social history  Review of Systems - no fevers, unintentional weight loss, vision loss, hearing loss, chest pain, sob, hemoptysis, melena, hematochezia, hematuria, genital discharge, changing or concerning skin lesions, bleeding, bruising, loc, thoughts of self harm or SI  Past Medical History:  Diagnosis Date  . Anxiety   . Arthritis   . GERD (gastroesophageal reflux disease)   . History of chicken pox   . Osteopenia     Past Surgical History:  Procedure Laterality Date  . CESAREAN SECTION     x2-1983, 1984  . REPLACEMENT TOTAL KNEE Left   . TONSILLECTOMY    . TOTAL HIP ARTHROPLASTY Right     Family History  Problem Relation Age of Onset  . Diabetes Father   . Arthritis Father   . Prostate cancer Father   . Colon cancer Father   . Cirrhosis Father   . Heart disease Paternal Grandmother   . Other Paternal Grandmother        brian tumor  . Heart disease Paternal Grandfather   . Crohn's disease Sister        1/2 sister  . Colon cancer Paternal Aunt     Social History   Socioeconomic History  . Marital status: Divorced    Spouse name: Not on file  . Number  of children: Not on file  . Years of education: Not on file  . Highest education level: Not on file  Occupational History  . Not on file  Social Needs  . Financial resource strain: Not on file  . Food insecurity:    Worry: Not on file    Inability: Not on file  . Transportation needs:    Medical: Not on file    Non-medical: Not on file  Tobacco Use  . Smoking status: Former Research scientist (life sciences)  . Smokeless tobacco: Never Used  Substance and Sexual Activity  . Alcohol use: Yes    Alcohol/week: 1.0 - 2.0 standard drinks    Types: 1 - 2 Glasses of wine per week    Comment: 1-2 drinks daily   . Drug use: No  . Sexual activity: Not on file  Lifestyle  . Physical activity:    Days per week: Not on file    Minutes per session: Not on file  . Stress: Not on file  Relationships  . Social connections:    Talks on phone: Not on file    Gets together: Not on file    Attends religious service: Not on file    Active member of club or organization: Not on file    Attends meetings of clubs or organizations: Not on file    Relationship status: Not on file  Other Topics Concern  . Not on  file  Social History Narrative   Work or School: Acupuncturist Situation: lives with duaghter      Spiritual Beliefs: Christian      Lifestyle: no regular exercise; diet is ok              Current Outpatient Medications:  .  Ascorbic Acid (VITAMIN C PO), Take 1 tablet by mouth daily. , Disp: , Rfl:  .  cetirizine (ZYRTEC) 10 MG tablet, Take 10 mg by mouth daily., Disp: , Rfl:  .  Cholecalciferol (VITAMIN D3) 1000 units CAPS, Take 1 capsule by mouth daily., Disp: , Rfl:  .  fluticasone (FLONASE) 50 MCG/ACT nasal spray, Place 1 spray into both nostrils daily., Disp: 16 g, Rfl: 6 .  omeprazole (PRILOSEC) 40 MG capsule, Take 40 mg by mouth daily., Disp: , Rfl:   Current Facility-Administered Medications:  .  0.9 %  sodium chloride infusion, 500 mL, Intravenous, Once, Armbruster, Carlota Raspberry,  MD  EXAM:  Vitals:   03/12/18 1013  BP: 108/60  Pulse: 68  Temp: 98.3 F (36.8 C)   Body mass index is 32.02 kg/m.  GENERAL: vitals reviewed and listed below, alert, oriented, appears well hydrated and in no acute distress  HEENT: head atraumatic, PERRLA, normal appearance of eyes, ears, nose and mouth. moist mucus membranes.  NECK: supple, no masses or lymphadenopathy  LUNGS: clear to auscultation bilaterally, no rales, rhonchi or wheeze  CV: HRRR, no peripheral edema or cyanosis, normal pedal pulses  ABDOMEN: bowel sounds normal, soft, non tender to palpation, no masses, no rebound or guarding  GU/BREAST: sees gyn, declined  SKIN: no rash or abnormal lesions  MS: normal gait, moves all extremities normally  NEURO: normal gait, speech and thought processing grossly intact, muscle tone grossly intact throughout  PSYCH: normal affect, pleasant and cooperative  ASSESSMENT AND PLAN:  Discussed the following assessment and plan:  PREVENTIVE EXAM: -Discussed and advised all [place] preventive services health task force level A and B recommendations for age, sex and risks. -Advised at least 150 minutes of exercise per week and a healthy diet  -labs, studies and vaccines per orders this encounter     Patient Instructions  BEFORE YOU LEAVE: -labs -follow up: yearly for physical  We have ordered labs or studies at this visit. It can take up to 1-2 weeks for results and processing. IF results require follow up or explanation, we will call you with instructions. Clinically stable results will be released to your [facility]. If you have not heard from [place] or cannot find your results in [facility] in 2 weeks please contact our office at [phone].  If you are not yet signed up for [facility], please consider signing up.   Preventive Care 40-64 Years, Female Preventive care refers to lifestyle choices and visits with your health care provider that can promote health and  wellness. What does preventive care include?   A yearly physical exam. This is also called an annual well check.  Dental exams once or twice a year.  Routine eye exams. Ask your health care provider how often you should have your eyes checked.  Personal lifestyle choices, including: ? Daily care of your teeth and gums. ? Regular physical activity. ? Eating a healthy diet. ? Avoiding tobacco and drug use. ? Limiting alcohol use. ? Practicing safe sex. ? Taking vitamin and mineral supplements as recommended by your health care provider. What happens during an annual well check? The services  and screenings done by your health care provider during your annual well check will depend on your age, overall health, lifestyle risk factors, and family history of disease. Counseling Your health care provider may ask you questions about your:  Alcohol use.  Tobacco use.  Drug use.  Emotional well-being.  Home and relationship well-being.  Sexual activity.  Eating habits.  Work and work Statistician.  Method of birth control.  Menstrual cycle.  Pregnancy history. Screening You may have the following tests or measurements:  Height, weight, and BMI.  Blood pressure.  Lipid and cholesterol levels. These may be checked every 5 years, or more frequently if you are over 28 years old.  Skin check.  Lung cancer screening. You may have this screening every year starting at age 69 if you have a 30-pack-year history of smoking and currently smoke or have quit within the past 15 years.  Colorectal cancer screening. All adults should have this screening starting at age 60 and continuing until age 31. Your health care provider may recommend screening at age 8. You will have tests every 1-10 years, depending on your results and the type of screening test. People at increased risk should start screening at an earlier age. Screening tests may include: ? Guaiac-based fecal occult blood  testing. ? Fecal immunochemical test (FIT). ? Stool DNA test. ? Virtual colonoscopy. ? Sigmoidoscopy. During this test, a flexible tube with a tiny camera (sigmoidoscope) is used to examine your rectum and lower colon. The sigmoidoscope is inserted through your anus into your rectum and lower colon. ? Colonoscopy. During this test, a long, thin, flexible tube with a tiny camera (colonoscope) is used to examine your entire colon and rectum.  Hepatitis C blood test.  Hepatitis B blood test.  Sexually transmitted disease (STD) testing.  Diabetes screening. This is done by checking your blood sugar (glucose) after you have not eaten for a while (fasting). You may have this done every 1-3 years.  Mammogram. This may be done every 1-2 years. Talk to your health care provider about when you should start having regular mammograms. This may depend on whether you have a family history of breast cancer.  BRCA-related cancer screening. This may be done if you have a family history of breast, ovarian, tubal, or peritoneal cancers.  Pelvic exam and Pap test. This may be done every 3 years starting at age 67. Starting at age 77, this may be done every 5 years if you have a Pap test in combination with an HPV test.  Bone density scan. This is done to screen for osteoporosis. You may have this scan if you are at high risk for osteoporosis. Discuss your test results, treatment options, and if necessary, the need for more tests with your health care provider. Vaccines Your health care provider may recommend certain vaccines, such as:  Influenza vaccine. This is recommended every year.  Tetanus, diphtheria, and acellular pertussis (Tdap, Td) vaccine. You may need a Td booster every 10 years.  Varicella vaccine. You may need this if you have not been vaccinated.  Zoster vaccine. You may need this after age 84.  Measles, mumps, and rubella (MMR) vaccine. You may need at least one dose of MMR if you were  born in 1957 or later. You may also need a second dose.  Pneumococcal 13-valent conjugate (PCV13) vaccine. You may need this if you have certain conditions and were not previously vaccinated.  Pneumococcal polysaccharide (PPSV23) vaccine. You may need one or  two doses if you smoke cigarettes or if you have certain conditions.  Meningococcal vaccine. You may need this if you have certain conditions.  Hepatitis A vaccine. You may need this if you have certain conditions or if you travel or work in places where you may be exposed to hepatitis A.  Hepatitis B vaccine. You may need this if you have certain conditions or if you travel or work in places where you may be exposed to hepatitis B.  Haemophilus influenzae type b (Hib) vaccine. You may need this if you have certain conditions. Talk to your health care provider about which screenings and vaccines you need and how often you need them. This information is not intended to replace advice given to you by your health care provider. Make sure you discuss any questions you have with your health care provider. Document Released: 02/27/2015 Document Revised: 03/23/2017 Document Reviewed: 12/02/2014 Elsevier Interactive Patient Education  2019 [employer].          No follow-ups on file.  Lucretia Kern, DO

## 2018-03-12 NOTE — Patient Instructions (Signed)
BEFORE YOU LEAVE: -labs -follow up: yearly for physical  We have ordered labs or studies at this visit. It can take up to 1-2 weeks for results and processing. IF results require follow up or explanation, we will call you with instructions. Clinically stable results will be released to your [facility]. If you have not heard from [place] or cannot find your results in [facility] in 2 weeks please contact our office at [phone].  If you are not yet signed up for [facility], please consider signing up.   Preventive Care 40-64 Years, Female Preventive care refers to lifestyle choices and visits with your health care provider that can promote health and wellness. What does preventive care include?   A yearly physical exam. This is also called an annual well check.  Dental exams once or twice a year.  Routine eye exams. Ask your health care provider how often you should have your eyes checked.  Personal lifestyle choices, including: ? Daily care of your teeth and gums. ? Regular physical activity. ? Eating a healthy diet. ? Avoiding tobacco and drug use. ? Limiting alcohol use. ? Practicing safe sex. ? Taking vitamin and mineral supplements as recommended by your health care provider. What happens during an annual well check? The services and screenings done by your health care provider during your annual well check will depend on your age, overall health, lifestyle risk factors, and family history of disease. Counseling Your health care provider may ask you questions about your:  Alcohol use.  Tobacco use.  Drug use.  Emotional well-being.  Home and relationship well-being.  Sexual activity.  Eating habits.  Work and work Statistician.  Method of birth control.  Menstrual cycle.  Pregnancy history. Screening You may have the following tests or measurements:  Height, weight, and BMI.  Blood pressure.  Lipid and cholesterol levels. These may be checked every 5 years, or more  frequently if you are over 41 years old.  Skin check.  Lung cancer screening. You may have this screening every year starting at age 32 if you have a 30-pack-year history of smoking and currently smoke or have quit within the past 15 years.  Colorectal cancer screening. All adults should have this screening starting at age 52 and continuing until age 57. Your health care provider may recommend screening at age 3. You will have tests every 1-10 years, depending on your results and the type of screening test. People at increased risk should start screening at an earlier age. Screening tests may include: ? Guaiac-based fecal occult blood testing. ? Fecal immunochemical test (FIT). ? Stool DNA test. ? Virtual colonoscopy. ? Sigmoidoscopy. During this test, a flexible tube with a tiny camera (sigmoidoscope) is used to examine your rectum and lower colon. The sigmoidoscope is inserted through your anus into your rectum and lower colon. ? Colonoscopy. During this test, a long, thin, flexible tube with a tiny camera (colonoscope) is used to examine your entire colon and rectum.  Hepatitis C blood test.  Hepatitis B blood test.  Sexually transmitted disease (STD) testing.  Diabetes screening. This is done by checking your blood sugar (glucose) after you have not eaten for a while (fasting). You may have this done every 1-3 years.  Mammogram. This may be done every 1-2 years. Talk to your health care provider about when you should start having regular mammograms. This may depend on whether you have a family history of breast cancer.  BRCA-related cancer screening. This may be done if  you have a family history of breast, ovarian, tubal, or peritoneal cancers.  Pelvic exam and Pap test. This may be done every 3 years starting at age 75. Starting at age 75, this may be done every 5 years if you have a Pap test in combination with an HPV test.  Bone density scan. This is done to screen for  osteoporosis. You may have this scan if you are at high risk for osteoporosis. Discuss your test results, treatment options, and if necessary, the need for more tests with your health care provider. Vaccines Your health care provider may recommend certain vaccines, such as:  Influenza vaccine. This is recommended every year.  Tetanus, diphtheria, and acellular pertussis (Tdap, Td) vaccine. You may need a Td booster every 10 years.  Varicella vaccine. You may need this if you have not been vaccinated.  Zoster vaccine. You may need this after age 51.  Measles, mumps, and rubella (MMR) vaccine. You may need at least one dose of MMR if you were born in 1957 or later. You may also need a second dose.  Pneumococcal 13-valent conjugate (PCV13) vaccine. You may need this if you have certain conditions and were not previously vaccinated.  Pneumococcal polysaccharide (PPSV23) vaccine. You may need one or two doses if you smoke cigarettes or if you have certain conditions.  Meningococcal vaccine. You may need this if you have certain conditions.  Hepatitis A vaccine. You may need this if you have certain conditions or if you travel or work in places where you may be exposed to hepatitis A.  Hepatitis B vaccine. You may need this if you have certain conditions or if you travel or work in places where you may be exposed to hepatitis B.  Haemophilus influenzae type b (Hib) vaccine. You may need this if you have certain conditions. Talk to your health care provider about which screenings and vaccines you need and how often you need them. This information is not intended to replace advice given to you by your health care provider. Make sure you discuss any questions you have with your health care provider. Document Released: 02/27/2015 Document Revised: 03/23/2017 Document Reviewed: 12/02/2014 Elsevier Interactive Patient Education  2019 [employer].

## 2018-03-13 LAB — HEPATITIS C ANTIBODY
Hepatitis C Ab: NONREACTIVE
SIGNAL TO CUT-OFF: 0.03 (ref <1.00)

## 2018-05-28 ENCOUNTER — Ambulatory Visit: Payer: BLUE CROSS/BLUE SHIELD | Admitting: Pulmonary Disease

## 2018-08-01 DIAGNOSIS — Z1389 Encounter for screening for other disorder: Secondary | ICD-10-CM | POA: Diagnosis not present

## 2018-08-01 DIAGNOSIS — Z130 Encounter for screening for diseases of the blood and blood-forming organs and certain disorders involving the immune mechanism: Secondary | ICD-10-CM | POA: Diagnosis not present

## 2018-08-01 DIAGNOSIS — Z6831 Body mass index (BMI) 31.0-31.9, adult: Secondary | ICD-10-CM | POA: Diagnosis not present

## 2018-08-01 DIAGNOSIS — Z1231 Encounter for screening mammogram for malignant neoplasm of breast: Secondary | ICD-10-CM | POA: Diagnosis not present

## 2018-08-01 DIAGNOSIS — Z01419 Encounter for gynecological examination (general) (routine) without abnormal findings: Secondary | ICD-10-CM | POA: Diagnosis not present

## 2019-01-24 DIAGNOSIS — H2513 Age-related nuclear cataract, bilateral: Secondary | ICD-10-CM | POA: Diagnosis not present

## 2019-01-24 DIAGNOSIS — H524 Presbyopia: Secondary | ICD-10-CM | POA: Diagnosis not present

## 2019-04-10 DIAGNOSIS — Z0100 Encounter for examination of eyes and vision without abnormal findings: Secondary | ICD-10-CM | POA: Diagnosis not present

## 2019-04-16 ENCOUNTER — Ambulatory Visit (INDEPENDENT_AMBULATORY_CARE_PROVIDER_SITE_OTHER): Payer: Medicare HMO

## 2019-04-16 ENCOUNTER — Other Ambulatory Visit: Payer: Self-pay

## 2019-04-16 ENCOUNTER — Encounter: Payer: Self-pay | Admitting: Sports Medicine

## 2019-04-16 ENCOUNTER — Ambulatory Visit (INDEPENDENT_AMBULATORY_CARE_PROVIDER_SITE_OTHER): Payer: Medicare HMO | Admitting: Sports Medicine

## 2019-04-16 ENCOUNTER — Other Ambulatory Visit: Payer: Self-pay | Admitting: Sports Medicine

## 2019-04-16 VITALS — BP 120/77 | HR 75

## 2019-04-16 DIAGNOSIS — M76822 Posterior tibial tendinitis, left leg: Secondary | ICD-10-CM | POA: Diagnosis not present

## 2019-04-16 DIAGNOSIS — M79672 Pain in left foot: Secondary | ICD-10-CM | POA: Diagnosis not present

## 2019-04-16 DIAGNOSIS — M19079 Primary osteoarthritis, unspecified ankle and foot: Secondary | ICD-10-CM | POA: Diagnosis not present

## 2019-04-16 DIAGNOSIS — M7662 Achilles tendinitis, left leg: Secondary | ICD-10-CM

## 2019-04-16 DIAGNOSIS — M2141 Flat foot [pes planus] (acquired), right foot: Secondary | ICD-10-CM

## 2019-04-16 DIAGNOSIS — M79671 Pain in right foot: Secondary | ICD-10-CM | POA: Diagnosis not present

## 2019-04-16 DIAGNOSIS — M7661 Achilles tendinitis, right leg: Secondary | ICD-10-CM

## 2019-04-16 DIAGNOSIS — M1990 Unspecified osteoarthritis, unspecified site: Secondary | ICD-10-CM

## 2019-04-16 DIAGNOSIS — M2142 Flat foot [pes planus] (acquired), left foot: Secondary | ICD-10-CM | POA: Diagnosis not present

## 2019-04-16 NOTE — Patient Instructions (Signed)
Discuss with your PCP and next visit about Arthritic panel  Get Over the Counter topical diclofenac/Voltaren cream/gel to use to feet as needed for pain

## 2019-04-16 NOTE — Progress Notes (Signed)
Subjective: Patricia Burns is a 64 y.o. female patient presents to office with complaint of mild to moderate heel pain on the medial aspect of the left heel and ankle that started most recently but does admit to a long-term history of over 10 years of both feet hurting at the bottoms and across the tops.  Patient reports that she saw Dr. Gershon Mussel many years ago and was diagnosed with stress fractures and is wondering if this is the same issue or if there is something more that she needs to do for her feet.  Patient denies any significant swelling redness warmth but does admit to low-grade pain that makes her walk like an old lady when she is first getting out of bed in the morning.  Patient denies any other pedal complaints or any acute injury or trauma at this time.  Review of Systems  All other systems reviewed and are negative.   Patient Active Problem List   Diagnosis Date Noted  . GERD (gastroesophageal reflux disease) 02/21/2017  . Hx of abnormal cervical Pap smear - sees gyn 10/02/2013  . H/O bee sting allergy 10/02/2013  . Borderline abnormal TFTs 10/02/2013    Current Outpatient Medications on File Prior to Visit  Medication Sig Dispense Refill  . Ascorbic Acid (VITAMIN C PO) Take 1 tablet by mouth daily.     . cetirizine (ZYRTEC) 10 MG tablet Take 10 mg by mouth daily.    . Cholecalciferol (VITAMIN D3) 1000 units CAPS Take 1 capsule by mouth daily.    . fluticasone (FLONASE) 50 MCG/ACT nasal spray Place 1 spray into both nostrils daily. 16 g 6  . omeprazole (PRILOSEC) 40 MG capsule Take 40 mg by mouth daily.    Marland Kitchen UNABLE TO FIND calcium     Current Facility-Administered Medications on File Prior to Visit  Medication Dose Route Frequency Provider Last Rate Last Admin  . 0.9 %  sodium chloride infusion  500 mL Intravenous Once Armbruster, Carlota Raspberry, MD        Allergies  Allergen Reactions  . Other     Bee stings  . Amoxicillin Rash and Hives    Objective: Physical Exam General:  The patient is alert and oriented x3 in no acute distress.  Dermatology: Skin is warm, dry and supple bilateral lower extremities. Nails 1-10 are normal. There is no erythema, edema, no eccymosis, no open lesions present. Integument is otherwise unremarkable.  Vascular: Dorsalis Pedis pulse and Posterior Tibial pulse are 2/4 bilateral. Capillary fill time is immediate to all digits.  Neurological: Grossly intact to light touch with an achilles reflex of +2/5 and a  negative Tinel's sign bilateral.  Musculoskeletal: Tenderness to palpation at the medial heel more so along the posterior tibial tendon course on the left extending up into the ankle there is very mild pain along the plantar fascial insertion bilateral and now diffuse pain at the tops of both feet along the midfoot area.  There is mild limitation in ankle joint range of motion but all other joints are within normal limits except with a mild limitation at the first MPJs where there is of note dorsal bone spur consisting of arthritis changes at this area on x-ray.  Strength 5/5 in all groups bilateral.   Gait: Unassisted  Xray, Right/Left foot/ankle:  Normal osseous mineralization.  Joint space narrowing at ankle supportive of arthritis as well as midfoot and first MPJ, no obvious fracture/dislocation/boney destruction.  Mild calcaneal spur present with mild thickening of  plantar fascia. No other soft tissue abnormalities or radiopaque foreign bodies.   Assessment and Plan: Problem List Items Addressed This Visit    None    Visit Diagnoses    Posterior tibial tendon dysfunction (PTTD) of left lower extremity    -  Primary   Achilles tendonitis, bilateral       Pes planus of both feet       Arthritis of foot       Foot pain, bilateral          -Complete examination performed.  -Xrays reviewed -Discussed with patient in detail the condition of chronic history of foot pain likely suspicious of underlying arthritis versus changes  secondary to pes planus with now tendinitis at left foot and ankle along the posterior tibial tendon -Dispensed ankle gauntlet for patient to use on left and advised patient if this works well will benefit from custom orthotics -Explained and dispensed to patient daily stretching exercises. -Recommend patient to ice affected area 1-2x daily. -Recommend over-the-counter topical Voltaren gel to use to painful areas -Encourage patient to discuss with PCP at her follow-up visit arthritic panel which would may be helpful in telling us what type of arthritis she has since these changes are diffuse on x-ray -Patient to return to office for orthotics or sooner if problems or questions arise.  Landis Martins, DPM

## 2019-05-01 ENCOUNTER — Other Ambulatory Visit: Payer: Medicare HMO | Admitting: Orthotics

## 2019-07-17 ENCOUNTER — Ambulatory Visit (INDEPENDENT_AMBULATORY_CARE_PROVIDER_SITE_OTHER): Payer: Medicare HMO | Admitting: Family Medicine

## 2019-07-17 ENCOUNTER — Other Ambulatory Visit: Payer: Self-pay

## 2019-07-17 ENCOUNTER — Encounter: Payer: Self-pay | Admitting: Family Medicine

## 2019-07-17 VITALS — BP 102/80 | HR 85 | Temp 98.3°F | Ht 65.5 in | Wt 197.0 lb

## 2019-07-17 DIAGNOSIS — M2550 Pain in unspecified joint: Secondary | ICD-10-CM | POA: Diagnosis not present

## 2019-07-17 DIAGNOSIS — E2839 Other primary ovarian failure: Secondary | ICD-10-CM | POA: Diagnosis not present

## 2019-07-17 DIAGNOSIS — R5383 Other fatigue: Secondary | ICD-10-CM

## 2019-07-17 DIAGNOSIS — J302 Other seasonal allergic rhinitis: Secondary | ICD-10-CM | POA: Diagnosis not present

## 2019-07-17 DIAGNOSIS — K219 Gastro-esophageal reflux disease without esophagitis: Secondary | ICD-10-CM | POA: Diagnosis not present

## 2019-07-17 DIAGNOSIS — M8580 Other specified disorders of bone density and structure, unspecified site: Secondary | ICD-10-CM

## 2019-07-17 DIAGNOSIS — R7989 Other specified abnormal findings of blood chemistry: Secondary | ICD-10-CM

## 2019-07-17 DIAGNOSIS — Z0000 Encounter for general adult medical examination without abnormal findings: Secondary | ICD-10-CM

## 2019-07-17 DIAGNOSIS — Z13220 Encounter for screening for lipoid disorders: Secondary | ICD-10-CM

## 2019-07-17 LAB — CBC WITH DIFFERENTIAL/PLATELET
Basophils Absolute: 0.1 10*3/uL (ref 0.0–0.1)
Basophils Relative: 0.7 % (ref 0.0–3.0)
Eosinophils Absolute: 0.4 10*3/uL (ref 0.0–0.7)
Eosinophils Relative: 5.1 % — ABNORMAL HIGH (ref 0.0–5.0)
HCT: 44 % (ref 36.0–46.0)
Hemoglobin: 15.1 g/dL — ABNORMAL HIGH (ref 12.0–15.0)
Lymphocytes Relative: 33 % (ref 12.0–46.0)
Lymphs Abs: 2.7 10*3/uL (ref 0.7–4.0)
MCHC: 34.4 g/dL (ref 30.0–36.0)
MCV: 94.5 fl (ref 78.0–100.0)
Monocytes Absolute: 0.8 10*3/uL (ref 0.1–1.0)
Monocytes Relative: 9.9 % (ref 3.0–12.0)
Neutro Abs: 4.2 10*3/uL (ref 1.4–7.7)
Neutrophils Relative %: 51.3 % (ref 43.0–77.0)
Platelets: 214 10*3/uL (ref 150.0–400.0)
RBC: 4.66 Mil/uL (ref 3.87–5.11)
RDW: 13.4 % (ref 11.5–15.5)
WBC: 8.2 10*3/uL (ref 4.0–10.5)

## 2019-07-17 LAB — VITAMIN D 25 HYDROXY (VIT D DEFICIENCY, FRACTURES): VITD: 51.76 ng/mL (ref 30.00–100.00)

## 2019-07-17 LAB — COMPREHENSIVE METABOLIC PANEL
ALT: 87 U/L — ABNORMAL HIGH (ref 0–35)
AST: 75 U/L — ABNORMAL HIGH (ref 0–37)
Albumin: 4.4 g/dL (ref 3.5–5.2)
Alkaline Phosphatase: 82 U/L (ref 39–117)
BUN: 14 mg/dL (ref 6–23)
CO2: 31 mEq/L (ref 19–32)
Calcium: 9.7 mg/dL (ref 8.4–10.5)
Chloride: 103 mEq/L (ref 96–112)
Creatinine, Ser: 0.76 mg/dL (ref 0.40–1.20)
GFR: 76.67 mL/min (ref >60.00)
Glucose, Bld: 99 mg/dL (ref 70–99)
Potassium: 4.1 mEq/L (ref 3.5–5.1)
Sodium: 140 mEq/L (ref 135–145)
Total Bilirubin: 0.7 mg/dL (ref 0.2–1.2)
Total Protein: 7.1 g/dL (ref 6.0–8.3)

## 2019-07-17 LAB — LIPID PANEL
Cholesterol: 202 mg/dL — ABNORMAL HIGH (ref 0–200)
HDL: 47.3 mg/dL (ref >39.00)
LDL Cholesterol: 131 mg/dL — ABNORMAL HIGH (ref 0–99)
NonHDL: 155.14
Total CHOL/HDL Ratio: 4
Triglycerides: 120 mg/dL (ref 0.0–149.0)
VLDL: 24 mg/dL (ref 0.0–40.0)

## 2019-07-17 LAB — VITAMIN B12: Vitamin B-12: 194 pg/mL — ABNORMAL LOW (ref 211–911)

## 2019-07-17 LAB — SEDIMENTATION RATE: Sed Rate: 29 mm/hr (ref 0–30)

## 2019-07-17 MED ORDER — OMEPRAZOLE 20 MG PO CPDR: 40.0000 mg | DELAYED_RELEASE_CAPSULE | Freq: Every day | ORAL | Status: DC

## 2019-07-17 NOTE — Progress Notes (Signed)
Patricia Burns DOB: 18-Nov-1955 Encounter date: 07/17/2019  This is a 64 y.o. female who presents for complete physical   History of present illness/Additional concerns: She is due for bone density recheck. After knee and hip surgery ortho suggested that she have this.   Saw doc in 2015 and was told she had stress fractures in feet. Went to podiatry for evaluation pain in left ankle. Feet hurt all the time.   GERD: notes symptoms if she is not taking prilosec. Does well with this medication.   Had scratchy throat, stuffiness. Thought she had sleep apnea. Has post nasal drainage.   Energy level is low.    Follows with Dr. Ulanda Edison for gyn needs. Colonoscopy 03/2017; repeat in 10 years per GI (father hx of colon cancer over age 73)  Adult vaccines due  Topic Date Due   TETANUS/TDAP  01/17/2028    Past Medical History:  Diagnosis Date   Anxiety    Arthritis    GERD (gastroesophageal reflux disease)    History of chicken pox    Osteopenia    Past Surgical History:  Procedure Laterality Date   CESAREAN SECTION     x2-1983, 1984   REPLACEMENT TOTAL KNEE Left    TONSILLECTOMY     TOTAL HIP ARTHROPLASTY Right    Allergies  Allergen Reactions   Other     Bee stings   Amoxicillin Rash and Hives   Current Meds  Medication Sig   Ascorbic Acid (VITAMIN C PO) Take 1 tablet by mouth daily.    cetirizine (ZYRTEC) 10 MG tablet Take 10 mg by mouth daily.   Cholecalciferol (VITAMIN D3) 1000 units CAPS Take 1 capsule by mouth daily.   fluticasone (FLONASE) 50 MCG/ACT nasal spray Place 1 spray into both nostrils daily.   omeprazole (PRILOSEC) 20 MG capsule Take 2 capsules (40 mg total) by mouth daily.   UNABLE TO FIND calcium   [DISCONTINUED] omeprazole (PRILOSEC) 40 MG capsule Take 40 mg by mouth daily.   Current Facility-Administered Medications for the 07/17/19 encounter (Office Visit) with Caren Macadam, MD  Medication   0.9 %  sodium chloride infusion    Social History   Tobacco Use   Smoking status: Former Smoker   Smokeless tobacco: Never Used  Substance Use Topics   Alcohol use: Yes    Alcohol/week: 1.0 - 2.0 standard drinks    Types: 1 - 2 Glasses of wine per week    Comment: 1-2 drinks daily    Family History  Problem Relation Age of Onset   Diabetes Father    Arthritis Father    Prostate cancer Father    Colon cancer Father    Cirrhosis Father        alcohol related   Heart disease Paternal Grandmother    Other Paternal Grandmother        brain tumor   Heart disease Paternal Grandfather    Crohn's disease Sister        1/2 sister; died from surgical complications   Colon cancer Paternal Aunt    Arthritis Mother      Review of Systems  Constitutional: Positive for fatigue. Negative for activity change, appetite change, chills, fever and unexpected weight change.  HENT: Negative for congestion, ear pain, hearing loss, sinus pressure, sinus pain, sore throat and trouble swallowing.   Eyes: Negative for pain and visual disturbance.  Respiratory: Negative for cough, chest tightness, shortness of breath and wheezing.   Cardiovascular: Negative for chest  pain, palpitations and leg swelling.  Gastrointestinal: Negative for abdominal pain, blood in stool, constipation, diarrhea, nausea and vomiting.  Genitourinary: Negative for difficulty urinating and menstrual problem.  Musculoskeletal: Positive for arthralgias and back pain (chronic). Joint swelling: ankles.  Skin: Negative for rash.  Neurological: Negative for dizziness, weakness, numbness and headaches.  Hematological: Negative for adenopathy. Does not bruise/bleed easily.  Psychiatric/Behavioral: Negative for sleep disturbance and suicidal ideas. The patient is not nervous/anxious.     CBC:  Lab Results  Component Value Date   WBC 8.2 07/17/2019   HGB 15.1 (H) 07/17/2019   HCT 44.0 07/17/2019   MCHC 34.4 07/17/2019   RDW 13.4 07/17/2019   PLT  214.0 07/17/2019   CMP: Lab Results  Component Value Date   NA 140 07/17/2019   K 4.1 07/17/2019   CL 103 07/17/2019   CO2 31 07/17/2019   GLUCOSE 99 07/17/2019   BUN 14 07/17/2019   CREATININE 0.76 07/17/2019   CALCIUM 9.7 07/17/2019   PROT 7.1 07/17/2019   BILITOT 0.7 07/17/2019   ALKPHOS 82 07/17/2019   ALT 87 (H) 07/17/2019   AST 75 (H) 07/17/2019   LIPID: Lab Results  Component Value Date   CHOL 202 (H) 07/17/2019   TRIG 120.0 07/17/2019   HDL 47.30 07/17/2019   LDLCALC 131 (H) 07/17/2019    Objective:  BP 102/80 (BP Location: Left Arm, Patient Position: Sitting, Cuff Size: Large)    Pulse 85    Temp 98.3 F (36.8 C) (Temporal)    Ht 5' 5.5" (1.664 m)    Wt 197 lb (89.4 kg)    LMP 09/02/2013    BMI 32.28 kg/m   Weight: 197 lb (89.4 kg)   BP Readings from Last 3 Encounters:  07/17/19 102/80  04/16/19 120/77  03/12/18 108/60   Wt Readings from Last 3 Encounters:  07/17/19 197 lb (89.4 kg)  03/12/18 198 lb 6.4 oz (90 kg)  02/23/18 196 lb 9.6 oz (89.2 kg)    Physical Exam Constitutional:      General: She is not in acute distress.    Appearance: She is well-developed.  HENT:     Head: Normocephalic and atraumatic.     Right Ear: External ear normal.     Left Ear: External ear normal.     Mouth/Throat:     Pharynx: No oropharyngeal exudate.  Eyes:     Conjunctiva/sclera: Conjunctivae normal.     Pupils: Pupils are equal, round, and reactive to light.  Neck:     Thyroid: No thyromegaly.  Cardiovascular:     Rate and Rhythm: Normal rate and regular rhythm.     Heart sounds: Normal heart sounds. No murmur. No friction rub. No gallop.   Pulmonary:     Effort: Pulmonary effort is normal. No respiratory distress.     Breath sounds: Normal breath sounds. No wheezing or rales.  Abdominal:     General: Abdomen is flat. Bowel sounds are normal. There is no distension.     Palpations: Abdomen is soft. There is no mass.     Tenderness: There is no abdominal  tenderness. There is no guarding.     Hernia: No hernia is present.  Musculoskeletal:        General: No tenderness or deformity. Normal range of motion.     Cervical back: Normal range of motion and neck supple.     Right lower leg: No edema.     Left lower leg: No edema.  Comments: [place] nodules multiple fingers.  Mild bogginess PIPs  Lymphadenopathy:     Cervical: No cervical adenopathy.  Skin:    General: Skin is warm and dry.     Findings: No rash.  Neurological:     Mental Status: She is alert and oriented to person, place, and time.     Deep Tendon Reflexes: Reflexes normal.     Reflex Scores:      Tricep reflexes are 2+ on the right side and 2+ on the left side.      Bicep reflexes are 2+ on the right side and 2+ on the left side.      Brachioradialis reflexes are 2+ on the right side and 2+ on the left side.      Patellar reflexes are 2+ on the right side and 2+ on the left side. Psychiatric:        Speech: Speech normal.        Behavior: Behavior normal.        Thought Content: Thought content normal.     Assessment/Plan: Health Maintenance Due  Topic Date Due   COVID-19 Vaccine (1) Never done   PAP SMEAR-Modifier  06/19/2018   Health Maintenance reviewed.  We have ordered bone density.  She will call [facility] to add this onto mammogram.  1. Preventative health care We discussed importance of daily exercise.  Her foot pain is currently limiting her.  We discussed following up with podiatry and considering getting her orthotics to help with ankle pain so that she can exercise on a more regular basis.  We also discussed alternative exercises like swimming and biking which may not cause as much foot discomfort.  2. Gastroesophageal reflux disease, unspecified whether esophagitis present Acid reflux is well controlled with the Prilosec.  Continue this. - omeprazole (PRILOSEC) 20 MG capsule; Take 2 capsules (40 mg total) by mouth daily.  3. Osteopenia, unspecified  location We will plan to recheck bone density.  4. Seasonal allergies Currently using Flonase and Zyrtec.  5. Arthralgia, unspecified joint She wants to make sure there is no other types of arthritis going on, and I think this is reasonable since she is quite limited with joint pain. - ANA; Future - Rheumatoid factor; Future - Sedimentation rate; Future - ANA - Rheumatoid factor - Sedimentation rate  6. Fatigue, unspecified type She does not want to be evaluated for sleep apnea and states she would not wear any sort of machine to help with sleep. - CBC with Differential/Platelet; Future - Comprehensive metabolic panel; Future - Vitamin B12; Future - VITAMIN D 25 Hydroxy (Vit-D Deficiency, Fractures); Future - Vitamin B12 - VITAMIN D 25 Hydroxy (Vit-D Deficiency, Fractures) - Comprehensive metabolic panel - CBC with Differential/Platelet  7. Lipid screening - Lipid panel; Future - Lipid panel  8. Estrogen deficiency - DG Bone Density; Future  Return for pending bloodwork.  Micheline Rough, MD

## 2019-07-18 LAB — ANA: Anti Nuclear Antibody (ANA): NEGATIVE

## 2019-07-18 LAB — RHEUMATOID FACTOR: Rheumatoid fact SerPl-aCnc: <14 IU/mL (ref <14)

## 2019-07-24 NOTE — Addendum Note (Signed)
Addended by: Agnes Lawrence on: 07/24/2019 12:06 PM   Modules accepted: Orders

## 2019-08-21 ENCOUNTER — Telehealth: Payer: Self-pay | Admitting: Family Medicine

## 2019-08-21 NOTE — Telephone Encounter (Signed)
Pt is requesting a call back from you. Pt has had a cough that is getting worse because of her allergies. Pt would like to know if she can be seen in the office or if she can get a referral without being seen by Dr. Ethlyn Gallery?

## 2019-08-21 NOTE — Telephone Encounter (Signed)
Patient complains of recurrent cough since seeing Dr Maudie Mercury and she was referred to pulmonary.  Stated she was seen by the pulmonologist and a sleep study was recommended and she declined this.  Patient stated she wanted to know what Dr Ethlyn Gallery recommended at this point and questioned if this could be due to reflux or allergies? States now worse for the past 2 weeks with clear sputum.  Virtual appt scheduled with Dr Maudie Mercury on 7/8.

## 2019-08-22 ENCOUNTER — Encounter: Payer: Self-pay | Admitting: Family Medicine

## 2019-08-22 ENCOUNTER — Telehealth (INDEPENDENT_AMBULATORY_CARE_PROVIDER_SITE_OTHER): Payer: Medicare HMO | Admitting: Family Medicine

## 2019-08-22 DIAGNOSIS — R059 Cough, unspecified: Secondary | ICD-10-CM

## 2019-08-22 DIAGNOSIS — R062 Wheezing: Secondary | ICD-10-CM | POA: Diagnosis not present

## 2019-08-22 DIAGNOSIS — R053 Chronic cough: Secondary | ICD-10-CM

## 2019-08-22 DIAGNOSIS — R05 Cough: Secondary | ICD-10-CM

## 2019-08-22 DIAGNOSIS — Z7185 Encounter for immunization safety counseling: Secondary | ICD-10-CM

## 2019-08-22 DIAGNOSIS — Z7189 Other specified counseling: Secondary | ICD-10-CM | POA: Diagnosis not present

## 2019-08-22 MED ORDER — PREDNISONE 20 MG PO TABS: 40.0000 mg | ORAL_TABLET | Freq: Every day | ORAL | 0 refills | Status: DC

## 2019-08-22 MED ORDER — BENZONATATE 100 MG PO CAPS
100.0000 mg | ORAL_CAPSULE | Freq: Three times a day (TID) | ORAL | 0 refills | Status: DC | PRN
Start: 2019-08-22 — End: 2020-07-01

## 2019-08-22 NOTE — Progress Notes (Signed)
Virtual Visit via Telephone Note  I connected with Patricia Burns on 08/22/19 at 10:20 AM EDT by telephone and verified that I am speaking with the correct person using two identifiers.   I discussed the limitations, risks, security and privacy concerns of performing an evaluation and management service by telephone and the availability of in person appointments. I also discussed with the patient that there may be a patient responsible charge related to this service. The patient expressed understanding and agreed to proceed.  Location patient: home Location provider: work or home office Participants present for the call: patient, provider Patient did not have a visit in the prior 7 days to address this/these issue(s).   History of Present Illness:  Acute visit for a cough: -has underlying PND and chronic cough, has seen pulmonologist in the past -for last several weeks has had increased clear nasal congestion, increase cough and occ wheezing -granddaughter has had a cold -her daughter also had a cold -patient is not yet vaccinated for covid, has lots of questions -denies fevers, discolored sputum, SOB, loss of taste, covid exposures, flu exposures, NVD -she reports has been taking OTC antihistamine, flonase and PPO for chronic cough for some time but does not seem to help -on brief review of chart appears she never followed up with pulmonologist after the initial visit    Observations/Objective: Patient sounds cheerful and well on the phone. I do not appreciate any SOB. Speech and thought processing are grossly intact. Patient reported vitals:  Assessment and Plan:  Cough  Wheezing  Chronic cough  Vaccine counseling  -we discussed possible serious and likely etiologies, options for evaluation and workup, limitations of telemedicine visit vs in person visit, treatment, treatment risks and precautions. Pt prefers to treat via telemedicine empirically rather then risking or  undertaking an in person visit at this moment. For the acute worsening of cough and wheezing - suspect viral etiology with bronchitis and opted to treat with prednisone burst as she reports "I do not like inhalers." For the chronic cough advised she may have underlying asthma or another lung disease as it seems she has already tried empiric treatment for common causes (PND from allergies, silent reflux) and recommended pulmonology follow up for PFTs and further evaluation. She reports she will call for follow up after this visit. She agrees to call her PCP office if she can not find the pulmonologist's phone number or has any trouble scheduling. Spent quite a bit of time discussing the risks and benefits of COVID19 vaccines and answering questions. Advised current data show that the benefits outway risks.Patient agrees to seek prompt in person care if worsening, new symptoms arise, or if is not improving with treatment.  Follow Up Instructions:   I did not refer this patient for an OV in the next 24 hours for this/these issue(s).  I discussed the assessment and treatment plan with the patient. The patient was provided an opportunity to ask questions and all were answered. The patient agreed with the plan and demonstrated an understanding of the instructions.   The patient was advised to call back or seek an in-person evaluation if the symptoms worsen or if the condition fails to improve as anticipated.  I provided 25 minutes of non-face-to-face time during this encounter.   Lucretia Kern, DO

## 2019-09-04 ENCOUNTER — Other Ambulatory Visit (INDEPENDENT_AMBULATORY_CARE_PROVIDER_SITE_OTHER): Payer: Medicare HMO

## 2019-09-04 DIAGNOSIS — R7989 Other specified abnormal findings of blood chemistry: Secondary | ICD-10-CM

## 2019-09-04 DIAGNOSIS — Z6831 Body mass index (BMI) 31.0-31.9, adult: Secondary | ICD-10-CM | POA: Diagnosis not present

## 2019-09-04 DIAGNOSIS — Z124 Encounter for screening for malignant neoplasm of cervix: Secondary | ICD-10-CM | POA: Diagnosis not present

## 2019-09-04 DIAGNOSIS — Z1231 Encounter for screening mammogram for malignant neoplasm of breast: Secondary | ICD-10-CM | POA: Diagnosis not present

## 2019-09-04 LAB — HEPATIC FUNCTION PANEL
ALT: 151 U/L — ABNORMAL HIGH (ref 0–35)
AST: 96 U/L — ABNORMAL HIGH (ref 0–37)
Albumin: 4.2 g/dL (ref 3.5–5.2)
Alkaline Phosphatase: 78 U/L (ref 39–117)
Bilirubin, Direct: 0.1 mg/dL (ref 0.0–0.3)
Total Bilirubin: 0.5 mg/dL (ref 0.2–1.2)
Total Protein: 7.5 g/dL (ref 6.0–8.3)

## 2019-09-12 ENCOUNTER — Ambulatory Visit
Admission: RE | Admit: 2019-09-12 | Discharge: 2019-09-12 | Disposition: A | Discharge status: Home / Self Care | Payer: Medicare HMO | Source: Ambulatory Visit | Attending: Family Medicine | Admitting: Family Medicine

## 2019-09-12 DIAGNOSIS — K760 Fatty (change of) liver, not elsewhere classified: Secondary | ICD-10-CM | POA: Diagnosis not present

## 2019-09-12 DIAGNOSIS — R7989 Other specified abnormal findings of blood chemistry: Secondary | ICD-10-CM

## 2019-09-13 ENCOUNTER — Other Ambulatory Visit: Payer: Self-pay | Admitting: Family Medicine

## 2019-09-13 DIAGNOSIS — R7989 Other specified abnormal findings of blood chemistry: Secondary | ICD-10-CM

## 2019-09-16 ENCOUNTER — Telehealth: Payer: Self-pay | Admitting: Family Medicine

## 2019-09-16 ENCOUNTER — Other Ambulatory Visit: Payer: Self-pay | Admitting: *Deleted

## 2019-09-16 DIAGNOSIS — R7989 Other specified abnormal findings of blood chemistry: Secondary | ICD-10-CM

## 2019-09-16 NOTE — Telephone Encounter (Signed)
Pt call and stated that you told her that you would call her back so she said she want a call back.

## 2019-09-17 NOTE — Telephone Encounter (Signed)
Spoke with the pt and she wanted to know if Dr Ethlyn Gallery wanted her to see GI and if so what would they do?  As discussed yesterday, I advised the pt per the [place] results Dr Ethlyn Gallery stated she can repeat liver testing in 6 weeks, and if still elevated she would be referred to GI or the referral can be placed now if she would like to.  I advised the pt the liver is one of the specialities of a GI doctor and gave the pt an example of seeing a cardiologist if a patient has a heart problem.  Patient stated she will repeat the lab testing in 6 weeks instead.  Patient was also advised a note was sent to Dr Ethlyn Gallery to see if it is OK for her to continue Omeprazole and Zyrtec and she will be called back with this info.

## 2019-11-11 ENCOUNTER — Other Ambulatory Visit (INDEPENDENT_AMBULATORY_CARE_PROVIDER_SITE_OTHER): Payer: Medicare HMO

## 2019-11-11 DIAGNOSIS — R7989 Other specified abnormal findings of blood chemistry: Secondary | ICD-10-CM

## 2019-11-11 LAB — COMPREHENSIVE METABOLIC PANEL
ALT: 50 U/L — ABNORMAL HIGH (ref 0–35)
AST: 43 U/L — ABNORMAL HIGH (ref 0–37)
Albumin: 4.3 g/dL (ref 3.5–5.2)
Alkaline Phosphatase: 66 U/L (ref 39–117)
BUN: 14 mg/dL (ref 6–23)
CO2: 27 mEq/L (ref 19–32)
Calcium: 9.5 mg/dL (ref 8.4–10.5)
Chloride: 105 mEq/L (ref 96–112)
Creatinine, Ser: 0.87 mg/dL (ref 0.40–1.20)
GFR: 65.53 mL/min (ref >60.00)
Glucose, Bld: 92 mg/dL (ref 70–99)
Potassium: 4.2 mEq/L (ref 3.5–5.1)
Sodium: 140 mEq/L (ref 135–145)
Total Bilirubin: 0.5 mg/dL (ref 0.2–1.2)
Total Protein: 7.6 g/dL (ref 6.0–8.3)

## 2019-11-11 LAB — HEPATIC FUNCTION PANEL
ALT: 50 U/L — ABNORMAL HIGH (ref 0–35)
AST: 43 U/L — ABNORMAL HIGH (ref 0–37)
Albumin: 4.3 g/dL (ref 3.5–5.2)
Alkaline Phosphatase: 66 U/L (ref 39–117)
Bilirubin, Direct: 0.1 mg/dL (ref 0.0–0.3)
Total Bilirubin: 0.5 mg/dL (ref 0.2–1.2)
Total Protein: 7.6 g/dL (ref 6.0–8.3)

## 2019-11-11 NOTE — Addendum Note (Signed)
Addended by: Boris Lown B on: 11/11/2019 12:55 PM   Modules accepted: Orders

## 2019-11-12 LAB — GAMMA GT: GGT: 19 U/L (ref 3–65)

## 2020-02-23 DIAGNOSIS — Z1152 Encounter for screening for COVID-19: Secondary | ICD-10-CM | POA: Diagnosis not present

## 2020-05-19 NOTE — Progress Notes (Signed)
Subjective:   Patricia Burns is a 65 y.o. female who presents for an Initial Medicare Annual Wellness Visit.  I connected with Serrena Linderman today by telephone and verified that I am speaking with the correct person using two identifiers. Location patient: home Location provider: work Persons participating in the virtual visit: patient, provider.   I discussed the limitations, risks, security and privacy concerns of performing an evaluation and management service by telephone and the availability of in person appointments. I also discussed with the patient that there may be a patient responsible charge related to this service. The patient expressed understanding and verbally consented to this telephonic visit.    Interactive audio and video telecommunications were attempted between this provider and patient, however failed, due to patient having technical difficulties OR patient did not have access to video capability.  We continued and completed visit with audio only.      Review of Systems    n/a Cardiac Risk Factors include: advanced age (>51men, >79 women);dyslipidemia;hypertension;smoking/ tobacco exposure     Objective:    Today's Vitals   There is no height or weight on file to calculate BMI.  Advanced Directives 05/20/2020 04/06/2017  Does Patient Have a Medical Advance Directive? No No  Would patient like information on creating a medical advance directive? No - Patient declined No - Patient declined    Current Medications (verified) Outpatient Encounter Medications as of 05/20/2020  Medication Sig  . Ascorbic Acid (VITAMIN C PO) Take 1 tablet by mouth daily.   . Cholecalciferol (VITAMIN D3) 1000 units CAPS Take 1 capsule by mouth daily.  . fluticasone (FLONASE) 50 MCG/ACT nasal spray Place 1 spray into both nostrils daily.  Marland Kitchen omeprazole (PRILOSEC) 20 MG capsule Take 2 capsules (40 mg total) by mouth daily.  Marland Kitchen UNABLE TO FIND calcium  . benzonatate (TESSALON PERLES) 100 MG  capsule Take 1 capsule (100 mg total) by mouth 3 (three) times daily as needed. (Patient not taking: Reported on 05/20/2020)  . cetirizine (ZYRTEC) 10 MG tablet Take 10 mg by mouth daily. (Patient not taking: Reported on 05/20/2020)   Facility-Administered Encounter Medications as of 05/20/2020  Medication  . 0.9 %  sodium chloride infusion    Allergies (verified) Other and Amoxicillin   History: Past Medical History:  Diagnosis Date  . Anxiety   . Arthritis   . GERD (gastroesophageal reflux disease)   . History of chicken pox   . Osteopenia    Past Surgical History:  Procedure Laterality Date  . CESAREAN SECTION     x2-1983, 1984  . REPLACEMENT TOTAL KNEE Left   . TONSILLECTOMY    . TOTAL HIP ARTHROPLASTY Right    Family History  Problem Relation Age of Onset  . Diabetes Father   . Arthritis Father   . Prostate cancer Father   . Colon cancer Father   . Cirrhosis Father        alcohol related  . Heart disease Paternal Grandmother   . Other Paternal Grandmother        brain tumor  . Heart disease Paternal Grandfather   . Crohn's disease Sister        1/2 sister; died from surgical complications  . Colon cancer Paternal Aunt   . Arthritis Mother    Social History   Socioeconomic History  . Marital status: Divorced    Spouse name: Not on file  . Number of children: Not on file  . Years of education: Not on  file  . Highest education level: Not on file  Occupational History  . Not on file  Tobacco Use  . Smoking status: Former Research scientist (life sciences)  . Smokeless tobacco: Never Used  Vaping Use  . Vaping Use: Never used  Substance and Sexual Activity  . Alcohol use: Yes    Alcohol/week: 1.0 - 2.0 standard drink    Types: 1 - 2 Glasses of wine per week    Comment: 1-2 drinks daily   . Drug use: No  . Sexual activity: Not on file  Other Topics Concern  . Not on file  Social History Narrative   Work or School: Acupuncturist Situation: lives with duaghter       Spiritual Beliefs: Christian      Lifestyle: no regular exercise; diet is ok            Social Determinants of Radio broadcast assistant Strain: Low Risk   . Difficulty of Paying Living Expenses: Not hard at all  Food Insecurity: No Food Insecurity  . Worried About Charity fundraiser in the Last Year: Never true  . Ran Out of Food in the Last Year: Never true  Transportation Needs: No Transportation Needs  . Lack of Transportation (Medical): No  . Lack of Transportation (Non-Medical): No  Physical Activity: Insufficiently Active  . Days of Exercise per Week: 3 days  . Minutes of Exercise per Session: 30 min  Stress: No Stress Concern Present  . Feeling of Stress : Not at all  Social Connections: Moderately Isolated  . Frequency of Communication with Friends and Family: More than three times a week  . Frequency of Social Gatherings with Friends and Family: More than three times a week  . Attends Religious Services: 1 to 4 times per year  . Active Member of Clubs or Organizations: No  . Attends Archivist Meetings: Never  . Marital Status: Divorced    Tobacco Counseling Counseling given: Not Answered   Clinical Intake:  Pre-visit preparation completed: Yes  Pain : No/denies pain     Nutritional Risks: None Diabetes: No  How often do you need to have someone help you when you read instructions, pamphlets, or other written materials from your doctor or pharmacy?: 1 - Never What is the last grade level you completed in school?: GED  Diabetic?no  Interpreter Needed?: No  Information entered by :: L.Allaya Abbasi,LPN   Activities of Daily Living In your present state of health, do you have any difficulty performing the following activities: 05/20/2020  Hearing? N  Vision? N  Difficulty concentrating or making decisions? N  Walking or climbing stairs? N  Dressing or bathing? N  Doing errands, shopping? N  Preparing Food and eating ? N  Using the Toilet? N   In the past six months, have you accidently leaked urine? N  Do you have problems with loss of bowel control? N  Managing your Medications? N  Managing your Finances? N  Housekeeping or managing your Housekeeping? N  Some recent data might be hidden    Patient Care Team: Caren Macadam, MD as PCP - General (Family Medicine) Newton Pigg, MD as PCP - OBGYN (Obstetrics and Gynecology) Caren Macadam, MD as Consulting Physician (Family Medicine)  Indicate any recent Medical Services you may have received from other than [facility] providers in the past year (date may be approximate).     Assessment:   This is a routine wellness  examination for Sister.  Hearing/Vision screen  Hearing Screening   125Hz  250Hz  500Hz  1000Hz  2000Hz  3000Hz  4000Hz  6000Hz  8000Hz   Right ear:           Left ear:           Vision Screening Comments: Annual eye exams wears glasses  Dietary issues and exercise activities discussed: Current Exercise Habits: Home exercise routine, Type of exercise: walking, Time (Minutes): 30, Frequency (Times/Week): 3, Weekly Exercise (Minutes/Week): 90, Intensity: Mild, Exercise limited by: None identified  Goals    . Weight (lb) < 200 lb (90.7 kg)     Lose 10 lbs       Depression Screen PHQ 2/9 Scores 05/20/2020 05/20/2020 07/17/2019 03/12/2018 01/16/2018  PHQ - 2 Score 0 0 0 3 0  PHQ- 9 Score - - - 5 2    Fall Risk Fall Risk  05/20/2020  Falls in the past year? 0  Number falls in past yr: 0  Injury with Fall? 0  Follow up Falls evaluation completed    FALL RISK PREVENTION PERTAINING TO THE HOME:  Any stairs in or around the home? Yes  If so, are there any without handrails? Yes  Home free of loose throw rugs in walkways, pet beds, electrical cords, etc? Yes  Adequate lighting in your home to reduce risk of falls? Yes   ASSISTIVE DEVICES UTILIZED TO PREVENT FALLS:  Life alert? No  Use of a cane, walker or w/c? No  Grab bars in the bathroom? No  Shower chair or  bench in shower? No  Elevated toilet seat or a handicapped toilet? No    Cognitive Function:     Normal cognitive status assessed by direct observation by this Nurse Health Advisor. No abnormalities found.      Immunizations Immunization History  Administered Date(s) Administered  . Influenza,inj,Quad PF,6+ Mos 03/24/2016, 12/29/2016, 01/16/2018  . Tdap 01/16/2018    TDAP status: Up to date  Flu Vaccine status: Up to date  Pneumococcal vaccine status: Due, Education has been provided regarding the importance of this vaccine. Advised may receive this vaccine at local pharmacy or Health Dept. Aware to provide a copy of the vaccination record if obtained from local pharmacy or Health Dept. Verbalized acceptance and understanding.  Covid-19 vaccine status: Information provided on how to obtain vaccines.   Qualifies for Shingles Vaccine? Yes   Zostavax completed Yes   Shingrix Completed?: No.    Education has been provided regarding the importance of this vaccine. Patient has been advised to call insurance company to determine out of pocket expense if they have not yet received this vaccine. Advised may also receive vaccine at local pharmacy or Health Dept. Verbalized acceptance and understanding.  Screening Tests Health Maintenance  Topic Date Due  . COVID-19 Vaccine (1) Never done  . MAMMOGRAM  06/15/2018  . PAP SMEAR-Modifier  06/19/2018  . HIV Screening  07/16/2020 (Originally 10/05/1970)  . INFLUENZA VACCINE  09/14/2020  . COLONOSCOPY (Pts 45-65yrs Insurance coverage will need to be confirmed)  04/07/2027  . TETANUS/TDAP  01/17/2028  . Hepatitis C Screening  Completed  . HPV VACCINES  Aged Out    Health Maintenance  Health Maintenance Due  Topic Date Due  . COVID-19 Vaccine (1) Never done  . MAMMOGRAM  06/15/2018  . PAP SMEAR-Modifier  06/19/2018    Colorectal cancer screening: Type of screening: Colonoscopy. Completed 04/06/2017. Repeat every 10 years  Mammogram  status: Ordered 05/20/2020. Pt provided with contact info and advised to  call to schedule appt.   Bone Density status: Completed 01/26/2017. Results reflect: Bone density results: NORMAL. Repeat every 10 years.  Lung Cancer Screening: (Low Dose CT Chest recommended if Age 68-80 years, 30 pack-year currently smoking OR have quit w/in 15years.) does not qualify.   Lung Cancer Screening Referral: n/a  Additional Screening:  Hepatitis C Screening: does qualify  Vision Screening: Recommended annual ophthalmology exams for early detection of glaucoma and other disorders of the eye. Is the patient up to date with their annual eye exam?  Yes  Who is the provider or what is the name of the office in which the patient attends annual eye exams? [facility] If pt is not established with a provider, would they like to be referred to a provider to establish care? No .   Dental Screening: Recommended annual dental exams for proper oral hygiene  Community Resource Referral / Chronic Care Management: CRR required this visit?  No   CCM required this visit?  No      Plan:     I have personally reviewed and noted the following in the patient's chart:   . Medical and social history . Use of alcohol, tobacco or illicit drugs  . Current medications and supplements . Functional ability and status . Nutritional status . Physical activity . Advanced directives . List of other physicians . Hospitalizations, surgeries, and ER visits in previous 12 months . Vitals . Screenings to include cognitive, depression, and falls . Referrals and appointments  In addition, I have reviewed and discussed with patient certain preventive protocols, quality metrics, and best practice recommendations. A written personalized care plan for preventive services as well as general preventive health recommendations were provided to patient.     Randel Pigg, LPN   09/18/5362   Nurse Notes: none

## 2020-05-20 ENCOUNTER — Other Ambulatory Visit: Payer: Self-pay

## 2020-05-20 ENCOUNTER — Ambulatory Visit (INDEPENDENT_AMBULATORY_CARE_PROVIDER_SITE_OTHER): Payer: Medicare HMO

## 2020-05-20 DIAGNOSIS — Z0000 Encounter for general adult medical examination without abnormal findings: Secondary | ICD-10-CM

## 2020-05-20 DIAGNOSIS — Z1231 Encounter for screening mammogram for malignant neoplasm of breast: Secondary | ICD-10-CM

## 2020-05-20 NOTE — Patient Instructions (Signed)
Patricia Burns , Thank you for taking time to come for your Medicare Wellness Visit. I appreciate your ongoing commitment to your health goals. Please review the following plan we discussed and let me know if I can assist you in the future.   Screening recommendations/referrals: Colonoscopy:  Current 04/07/2027 Mammogram: referral completed Bone Density: current  Recommended yearly ophthalmology/optometry visit for glaucoma screening and checkup Recommended yearly dental visit for hygiene and checkup  Vaccinations: Influenza vaccine: Declined Pneumococcal vaccine: Declined Tdap vaccine: Current 12/32029 Shingles vaccine: declined  Advanced directives: none  Conditions/risks identified: none  Next appointment: 08/07/2020  $RemoveBef'[id number]$   Dr.Koberlien   Preventive Care 40-64 Years, Female Preventive care refers to lifestyle choices and visits with your health care provider that can promote health and wellness. What does preventive care include?  A yearly physical exam. This is also called an annual well check.  Dental exams once or twice a year.  Routine eye exams. Ask your health care provider how often you should have your eyes checked.  Personal lifestyle choices, including:  Daily care of your teeth and gums.  Regular physical activity.  Eating a healthy diet.  Avoiding tobacco and drug use.  Limiting alcohol use.  Practicing safe sex.  Taking low-dose aspirin daily starting at age 48.  Taking vitamin and mineral supplements as recommended by your health care provider. What happens during an annual well check? The services and screenings done by your health care provider during your annual well check will depend on your age, overall health, lifestyle risk factors, and family history of disease. Counseling  Your health care provider may ask you questions about your:  Alcohol use.  Tobacco use.  Drug use.  Emotional well-being.  Home and relationship  well-being.  Sexual activity.  Eating habits.  Work and work Statistician.  Method of birth control.  Menstrual cycle.  Pregnancy history. Screening  You may have the following tests or measurements:  Height, weight, and BMI.  Blood pressure.  Lipid and cholesterol levels. These may be checked every 5 years, or more frequently if you are over 21 years old.  Skin check.  Lung cancer screening. You may have this screening every year starting at age 36 if you have a 30-pack-year history of smoking and currently smoke or have quit within the past 15 years.  Fecal occult blood test (FOBT) of the stool. You may have this test every year starting at age 55.  Flexible sigmoidoscopy or colonoscopy. You may have a sigmoidoscopy every 5 years or a colonoscopy every 10 years starting at age 39.  Hepatitis C blood test.  Hepatitis B blood test.  Sexually transmitted disease (STD) testing.  Diabetes screening. This is done by checking your blood sugar (glucose) after you have not eaten for a while (fasting). You may have this done every 1-3 years.  Mammogram. This may be done every 1-2 years. Talk to your health care provider about when you should start having regular mammograms. This may depend on whether you have a family history of breast cancer.  BRCA-related cancer screening. This may be done if you have a family history of breast, ovarian, tubal, or peritoneal cancers.  Pelvic exam and Pap test. This may be done every 3 years starting at age 47. Starting at age 69, this may be done every 5 years if you have a Pap test in combination with an HPV test.  Bone density scan. This is done to screen for osteoporosis. You may have this scan  if you are at high risk for osteoporosis. Discuss your test results, treatment options, and if necessary, the need for more tests with your health care provider. Vaccines  Your health care provider may recommend certain vaccines, such  as:  Influenza vaccine. This is recommended every year.  Tetanus, diphtheria, and acellular pertussis (Tdap, Td) vaccine. You may need a Td booster every 10 years.  Zoster vaccine. You may need this after age 61.  Pneumococcal 13-valent conjugate (PCV13) vaccine. You may need this if you have certain conditions and were not previously vaccinated.  Pneumococcal polysaccharide (PPSV23) vaccine. You may need one or two doses if you smoke cigarettes or if you have certain conditions. Talk to your health care provider about which screenings and vaccines you need and how often you need them. This information is not intended to replace advice given to you by your health care provider. Make sure you discuss any questions you have with your health care provider. Document Released: 02/27/2015 Document Revised: 10/21/2015 Document Reviewed: 12/02/2014 Elsevier Interactive Patient Education  2017 [place] Prevention in the Home Falls can cause injuries. They can happen to people of all ages. There are many things you can do to make your home safe and to help prevent falls. What can I do on the outside of my home?  Regularly fix the edges of walkways and driveways and fix any cracks.  Remove anything that might make you trip as you walk through a door, such as a raised step or threshold.  Trim any bushes or trees on the path to your home.  Use bright outdoor lighting.  Clear any walking paths of anything that might make someone trip, such as rocks or tools.  Regularly check to see if handrails are loose or broken. Make sure that both sides of any steps have handrails.  Any raised decks and porches should have guardrails on the edges.  Have any leaves, snow, or ice cleared regularly.  Use sand or salt on walking paths during winter.  Clean up any spills in your garage right away. This includes oil or grease spills. What can I do in the bathroom?  Use night  lights.  Install grab bars by the toilet and in the tub and shower. Do not use towel bars as grab bars.  Use non-skid mats or decals in the tub or shower.  If you need to sit down in the shower, use a plastic, non-slip stool.  Keep the floor dry. Clean up any water that spills on the floor as soon as it happens.  Remove soap buildup in the tub or shower regularly.  Attach bath mats securely with double-sided non-slip rug tape.  Do not have throw rugs and other things on the floor that can make you trip. What can I do in the bedroom?  Use night lights.  Make sure that you have a light by your bed that is easy to reach.  Do not use any sheets or blankets that are too big for your bed. They should not hang down onto the floor.  Have a firm chair that has side arms. You can use this for support while you get dressed.  Do not have throw rugs and other things on the floor that can make you trip. What can I do in the kitchen?  Clean up any spills right away.  Avoid walking on wet floors.  Keep items that you use a lot in easy-to-reach places.  If  you need to reach something above you, use a strong step stool that has a grab bar.  Keep electrical cords out of the way.  Do not use floor polish or wax that makes floors slippery. If you must use wax, use non-skid floor wax.  Do not have throw rugs and other things on the floor that can make you trip. What can I do with my stairs?  Do not leave any items on the stairs.  Make sure that there are handrails on both sides of the stairs and use them. Fix handrails that are broken or loose. Make sure that handrails are as long as the stairways.  Check any carpeting to make sure that it is firmly attached to the stairs. Fix any carpet that is loose or worn.  Avoid having throw rugs at the top or bottom of the stairs. If you do have throw rugs, attach them to the floor with carpet tape.  Make sure that you have a light switch at the  top of the stairs and the bottom of the stairs. If you do not have them, ask someone to add them for you. What else can I do to help prevent falls?  Wear shoes that:  Do not have high heels.  Have rubber bottoms.  Are comfortable and fit you well.  Are closed at the toe. Do not wear sandals.  If you use a stepladder:  Make sure that it is fully opened. Do not climb a closed stepladder.  Make sure that both sides of the stepladder are locked into place.  Ask someone to hold it for you, if possible.  Clearly mark and make sure that you can see:  Any grab bars or handrails.  First and last steps.  Where the edge of each step is.  Use tools that help you move around (mobility aids) if they are needed. These include:  Canes.  Walkers.  Scooters.  Crutches.  Turn on the lights when you go into a dark area. Replace any light bulbs as soon as they burn out.  Set up your furniture so you have a clear path. Avoid moving your furniture around.  If any of your floors are uneven, fix them.  If there are any pets around you, be aware of where they are.  Review your medicines with your doctor. Some medicines can make you feel dizzy. This can increase your chance of falling. Ask your doctor what other things that you can do to help prevent falls. This information is not intended to replace advice given to you by your health care provider. Make sure you discuss any questions you have with your health care provider. Document Released: 11/27/2008 Document Revised: 07/09/2015 Document Reviewed: 03/07/2014 Elsevier Interactive Patient Education  2017 [employer].

## 2020-05-21 ENCOUNTER — Encounter: Payer: Self-pay | Admitting: Family Medicine

## 2020-07-01 ENCOUNTER — Telehealth (INDEPENDENT_AMBULATORY_CARE_PROVIDER_SITE_OTHER): Payer: Medicare HMO | Admitting: Family Medicine

## 2020-07-01 ENCOUNTER — Telehealth: Payer: Self-pay | Admitting: Family Medicine

## 2020-07-01 ENCOUNTER — Encounter: Payer: Self-pay | Admitting: Family Medicine

## 2020-07-01 DIAGNOSIS — R059 Cough, unspecified: Secondary | ICD-10-CM

## 2020-07-01 MED ORDER — DOXYCYCLINE HYCLATE 100 MG PO TABS: 100.0000 mg | ORAL_TABLET | Freq: Two times a day (BID) | ORAL | 0 refills | Status: DC

## 2020-07-01 NOTE — Telephone Encounter (Signed)
[facility] will fill the Rx and it'll be $16 and ready after 3:00pm. Patient is aware.

## 2020-07-01 NOTE — Telephone Encounter (Signed)
error 

## 2020-07-01 NOTE — Telephone Encounter (Signed)
Pt call and stated the antiobic call in cost 45.00 and she need something call in that don't cost that much.

## 2020-07-01 NOTE — Progress Notes (Signed)
Patient ID: Patricia Burns, female   DOB: 08/13/1955, 65 y.o.   MRN: [id number]  This visit type was conducted due to national recommendations for restrictions regarding the COVID-19 pandemic in an effort to limit this patient's exposure and mitigate transmission in our community.   Virtual Visit via Telephone Note  I connected with Patricia Burns on 07/01/20 at 10:00 AM EDT by telephone and verified that I am speaking with the correct person using two identifiers.   I discussed the limitations, risks, security and privacy concerns of performing an evaluation and management service by telephone and the availability of in person appointments. I also discussed with the patient that there may be a patient responsible charge related to this service. The patient expressed understanding and agreed to proceed.  Location patient: home Location provider: work or home office Participants present for the call: patient, provider Patient did not have a visit in the prior 7 days to address this/these issue(s).   History of Present Illness:  Patricia Burns called with onset early last week of cough and congestion.  She had COVID test which came back negative.  She denies any fever.  She has had some hoarseness for the past week.  She has had some progressive sinus congestion and intermittent headaches and some facial pain.  Leftover benzonatate which she has taken for cough.  She has allergies to penicillin.  Cough is productive of colored sputum intermittently.  Denies any vomiting or diarrhea.  She is a non-smoker.  Past Medical History:  Diagnosis Date  . Anxiety   . Arthritis   . GERD (gastroesophageal reflux disease)   . History of chicken pox   . Osteopenia    Past Surgical History:  Procedure Laterality Date  . CESAREAN SECTION     x2-1983, 1984  . REPLACEMENT TOTAL KNEE Left   . TONSILLECTOMY    . TOTAL HIP ARTHROPLASTY Right     reports that she has quit smoking. She has never used smokeless tobacco.  She reports current alcohol use of about 1.0 - 2.0 standard drink of alcohol per week. She reports that she does not use drugs. family history includes Arthritis in her father and mother; Cirrhosis in her father; Colon cancer in her father and paternal aunt; Crohn's disease in her sister; Diabetes in her father; Heart disease in her paternal grandfather and paternal grandmother; Other in her paternal grandmother; Prostate cancer in her father. Allergies  Allergen Reactions  . Other     Bee stings  . Amoxicillin Rash and Hives      Observations/Objective: Patient sounds cheerful and well on the phone. I do not appreciate any SOB. Speech and thought processing are grossly intact. Patient reported vitals:  Assessment and Plan:  URI symptoms with cough.  This may be all viral but patient feels like if anything she is getting worse then she is on about day 9-10.  Possible acute sinusitis  -Given duration will start doxycycline 100 mg twice daily for 10 days -Consider over-the-counter Mucinex -Plenty fluids and rest -Follow-up for any persistent or worsening symptoms.  Follow Up Instructions:  -As needed   99441 5-10 [id number] 11-20 [id number] 21-30 I did not refer this patient for an OV in the next 24 hours for this/these issue(s).  I discussed the assessment and treatment plan with the patient. The patient was provided an opportunity to ask questions and all were answered. The patient agreed with the plan and demonstrated an understanding of the instructions.   The  patient was advised to call back or seek an in-person evaluation if the symptoms worsen or if the condition fails to improve as anticipated.  I provided 14 minutes of non-face-to-face time during this encounter.   [name], MD

## 2020-08-07 ENCOUNTER — Ambulatory Visit (INDEPENDENT_AMBULATORY_CARE_PROVIDER_SITE_OTHER): Payer: Medicare Other | Admitting: Family Medicine

## 2020-08-07 ENCOUNTER — Other Ambulatory Visit: Payer: Self-pay

## 2020-08-07 ENCOUNTER — Encounter: Payer: Self-pay | Admitting: Family Medicine

## 2020-08-07 VITALS — BP 110/68 | HR 89 | Temp 98.1°F | Ht 65.0 in | Wt 186.0 lb

## 2020-08-07 DIAGNOSIS — E538 Deficiency of other specified B group vitamins: Secondary | ICD-10-CM

## 2020-08-07 DIAGNOSIS — Z13220 Encounter for screening for lipoid disorders: Secondary | ICD-10-CM | POA: Diagnosis not present

## 2020-08-07 DIAGNOSIS — Z0000 Encounter for general adult medical examination without abnormal findings: Secondary | ICD-10-CM | POA: Diagnosis not present

## 2020-08-07 DIAGNOSIS — K219 Gastro-esophageal reflux disease without esophagitis: Secondary | ICD-10-CM | POA: Diagnosis not present

## 2020-08-07 DIAGNOSIS — R748 Abnormal levels of other serum enzymes: Secondary | ICD-10-CM | POA: Diagnosis not present

## 2020-08-07 DIAGNOSIS — K760 Fatty (change of) liver, not elsewhere classified: Secondary | ICD-10-CM | POA: Diagnosis not present

## 2020-08-07 LAB — CBC WITH DIFFERENTIAL/PLATELET
Basophils Absolute: 0.1 10*3/uL (ref 0.0–0.1)
Basophils Relative: 0.9 % (ref 0.0–3.0)
Eosinophils Absolute: 0.2 10*3/uL (ref 0.0–0.7)
Eosinophils Relative: 2.9 % (ref 0.0–5.0)
HCT: 46.9 % — ABNORMAL HIGH (ref 36.0–46.0)
Hemoglobin: 15.8 g/dL — ABNORMAL HIGH (ref 12.0–15.0)
Lymphocytes Relative: 27.9 % (ref 12.0–46.0)
Lymphs Abs: 2.4 10*3/uL (ref 0.7–4.0)
MCHC: 33.7 g/dL (ref 30.0–36.0)
MCV: 95.5 fl (ref 78.0–100.0)
Monocytes Absolute: 0.8 10*3/uL (ref 0.1–1.0)
Monocytes Relative: 9.6 % (ref 3.0–12.0)
Neutro Abs: 5 10*3/uL (ref 1.4–7.7)
Neutrophils Relative %: 58.7 % (ref 43.0–77.0)
Platelets: 246 10*3/uL (ref 150.0–400.0)
RBC: 4.92 Mil/uL (ref 3.87–5.11)
RDW: 13.1 % (ref 11.5–15.5)
WBC: 8.6 10*3/uL (ref 4.0–10.5)

## 2020-08-07 LAB — VITAMIN B12: Vitamin B-12: 1476 pg/mL — ABNORMAL HIGH (ref 211–911)

## 2020-08-07 LAB — COMPREHENSIVE METABOLIC PANEL
ALT: 56 U/L — ABNORMAL HIGH (ref 0–35)
AST: 55 U/L — ABNORMAL HIGH (ref 0–37)
Albumin: 4.6 g/dL (ref 3.5–5.2)
Alkaline Phosphatase: 78 U/L (ref 39–117)
BUN: 16 mg/dL (ref 6–23)
CO2: 27 mEq/L (ref 19–32)
Calcium: 10 mg/dL (ref 8.4–10.5)
Chloride: 102 mEq/L (ref 96–112)
Creatinine, Ser: 0.97 mg/dL (ref 0.40–1.20)
GFR: 61.61 mL/min (ref >60.00)
Glucose, Bld: 101 mg/dL — ABNORMAL HIGH (ref 70–99)
Potassium: 4.5 mEq/L (ref 3.5–5.1)
Sodium: 138 mEq/L (ref 135–145)
Total Bilirubin: 0.7 mg/dL (ref 0.2–1.2)
Total Protein: 7.4 g/dL (ref 6.0–8.3)

## 2020-08-07 LAB — LIPID PANEL
Cholesterol: 205 mg/dL — ABNORMAL HIGH (ref 0–200)
HDL: 52.9 mg/dL (ref >39.00)
LDL Cholesterol: 136 mg/dL — ABNORMAL HIGH (ref 0–99)
NonHDL: 152.33
Total CHOL/HDL Ratio: 4
Triglycerides: 83 mg/dL (ref 0.0–149.0)
VLDL: 16.6 mg/dL (ref 0.0–40.0)

## 2020-08-07 LAB — FOLATE: Folate: 10.5 ng/mL (ref >5.9)

## 2020-08-07 NOTE — Patient Instructions (Addendum)
Health Maintenance Due  Topic Date Due   COVID-19 Vaccine (1) Never done   HIV Screening  Never done   Zoster Vaccines- Shingrix (1 of 2) Never done   MAMMOGRAM  06/15/2018   PAP SMEAR-Modifier  06/19/2018

## 2020-08-07 NOTE — Progress Notes (Signed)
Patricia Burns DOB: 06/25/1955 Encounter date: 08/07/2020  This is a 65 y.o. female who presents for complete physical   History of present illness/Additional concerns: Last visit with me was 07/2019 for physical.   Things are going ok for her. Here with grandson today.   GERD: prilosec - generally well controlled with this.   Slight elevated liver enzymes noted last year; stable and improved on follow up. Liver US revealed fatty liver.   B12 def: she is supplementing with sublingual; not sure of dose.   Follows with Dr. Ulanda Edison for gynecology.  Colonoscopy 03/2017; repeat in 10 years Mammogram: she gets this at gyn office.  DEXA: she is not sure if she can get this at gyn office  Past Medical History:  Diagnosis Date   Anxiety    Arthritis    GERD (gastroesophageal reflux disease)    History of chicken pox    Osteopenia    Past Surgical History:  Procedure Laterality Date   CESAREAN SECTION     x2-1983, 1984   REPLACEMENT TOTAL KNEE Left    TONSILLECTOMY     TOTAL HIP ARTHROPLASTY Right    Allergies  Allergen Reactions   Other     Bee stings   Amoxicillin Rash and Hives   Current Meds  Medication Sig   Ascorbic Acid (VITAMIN C PO) Take 1 tablet by mouth daily.    cetirizine (ZYRTEC) 10 MG tablet Take 10 mg by mouth daily.   Cholecalciferol (VITAMIN D3) 1000 units CAPS Take 1 capsule by mouth daily.   doxycycline (VIBRA-TABS) 100 MG tablet Take 1 tablet (100 mg total) by mouth 2 (two) times daily.   fluticasone (FLONASE) 50 MCG/ACT nasal spray Place 1 spray into both nostrils daily.   Niacin (VITAMIN B-3 PO) Take by mouth.   omeprazole (PRILOSEC) 20 MG capsule Take 2 capsules (40 mg total) by mouth daily.   UNABLE TO FIND calcium   Current Facility-Administered Medications for the 08/07/20 encounter (Office Visit) with Caren Macadam, MD  Medication   0.9 %  sodium chloride infusion   Social History   Tobacco Use   Smoking status: Former    Pack years:  0.00   Smokeless tobacco: Never  Substance Use Topics   Alcohol use: Yes    Alcohol/week: 1.0 - 2.0 standard drink    Types: 1 - 2 Glasses of wine per week    Comment: 1-2 drinks daily    Family History  Problem Relation Age of Onset   Arthritis Mother    Diabetes Father    Arthritis Father    Prostate cancer Father    Colon cancer Father    Cirrhosis Father        alcohol related   Crohn's disease Sister        1/2 sister; died from surgical complications   Heart disease Paternal Grandmother    Other Paternal Grandmother        brain tumor   Heart disease Paternal Grandfather    Colon cancer Paternal Aunt    Colon cancer Brother      Review of Systems  Constitutional:  Negative for activity change, appetite change, chills, fatigue, fever and unexpected weight change.  HENT:  Negative for congestion, ear pain, hearing loss, sinus pressure, sinus pain, sore throat and trouble swallowing.   Eyes:  Negative for pain and visual disturbance.  Respiratory:  Negative for cough, chest tightness, shortness of breath and wheezing.   Cardiovascular:  Negative for  chest pain, palpitations and leg swelling.  Gastrointestinal:  Negative for abdominal pain, blood in stool, constipation, diarrhea, nausea and vomiting.  Genitourinary:  Negative for difficulty urinating and menstrual problem.  Musculoskeletal:  Negative for arthralgias and back pain.  Skin:  Negative for rash.  Neurological:  Negative for dizziness, weakness, numbness and headaches.  Hematological:  Negative for adenopathy. Does not bruise/bleed easily.  Psychiatric/Behavioral:  Negative for sleep disturbance and suicidal ideas. The patient is not nervous/anxious.    CBC:  Lab Results  Component Value Date   WBC 8.2 07/17/2019   HGB 15.1 (H) 07/17/2019   HCT 44.0 07/17/2019   MCHC 34.4 07/17/2019   RDW 13.4 07/17/2019   PLT 214.0 07/17/2019   CMP: Lab Results  Component Value Date   NA 140 11/11/2019   K 4.2  11/11/2019   CL 105 11/11/2019   CO2 27 11/11/2019   GLUCOSE 92 11/11/2019   BUN 14 11/11/2019   CREATININE 0.87 11/11/2019   CALCIUM 9.5 11/11/2019   PROT 7.6 11/11/2019   PROT 7.6 11/11/2019   BILITOT 0.5 11/11/2019   BILITOT 0.5 11/11/2019   ALKPHOS 66 11/11/2019   ALKPHOS 66 11/11/2019   ALT 50 (H) 11/11/2019   ALT 50 (H) 11/11/2019   AST 43 (H) 11/11/2019   AST 43 (H) 11/11/2019   LIPID: Lab Results  Component Value Date   CHOL 202 (H) 07/17/2019   TRIG 120.0 07/17/2019   HDL 47.30 07/17/2019   LDLCALC 131 (H) 07/17/2019    Objective:  BP 110/68   Pulse 89   Temp 98.1 F (36.7 C) (Oral)   Ht 5\' 5"  (1.651 m)   Wt 186 lb (84.4 kg)   LMP 09/02/2013   SpO2 96%   BMI 30.95 kg/m   Weight: 186 lb (84.4 kg)   BP Readings from Last 3 Encounters:  08/07/20 110/68  07/17/19 102/80  04/16/19 120/77   Wt Readings from Last 3 Encounters:  08/07/20 186 lb (84.4 kg)  07/17/19 197 lb (89.4 kg)  03/12/18 198 lb 6.4 oz (90 kg)    Physical Exam Constitutional:      General: She is not in acute distress.    Appearance: She is well-developed.  HENT:     Head: Normocephalic and atraumatic.     Right Ear: External ear normal.     Left Ear: External ear normal.     Mouth/Throat:     Pharynx: No oropharyngeal exudate.  Eyes:     Conjunctiva/sclera: Conjunctivae normal.     Pupils: Pupils are equal, round, and reactive to light.  Neck:     Thyroid: No thyromegaly.  Cardiovascular:     Rate and Rhythm: Normal rate and regular rhythm.     Heart sounds: Normal heart sounds. No murmur heard.   No friction rub. No gallop.  Pulmonary:     Effort: Pulmonary effort is normal.     Breath sounds: Normal breath sounds.  Abdominal:     General: Bowel sounds are normal. There is no distension.     Palpations: Abdomen is soft. There is no mass.     Tenderness: There is no abdominal tenderness. There is no guarding.     Hernia: No hernia is present.  Musculoskeletal:         General: No tenderness or deformity. Normal range of motion.     Cervical back: Normal range of motion and neck supple.  Lymphadenopathy:     Cervical: No cervical adenopathy.  Skin:  General: Skin is warm and dry.     Findings: No rash.  Neurological:     Mental Status: She is alert and oriented to person, place, and time.     Deep Tendon Reflexes: Reflexes normal.     Reflex Scores:      Tricep reflexes are 2+ on the right side and 2+ on the left side.      Bicep reflexes are 2+ on the right side and 2+ on the left side.      Brachioradialis reflexes are 2+ on the right side and 2+ on the left side.      Patellar reflexes are 2+ on the right side and 2+ on the left side. Psychiatric:        Speech: Speech normal.        Behavior: Behavior normal.        Thought Content: Thought content normal.    Assessment/Plan: Health Maintenance Due  Topic Date Due   COVID-19 Vaccine (1) Never done   HIV Screening  Never done   Zoster Vaccines- Shingrix (1 of 2) Never done   MAMMOGRAM  06/15/2018   PAP SMEAR-Modifier  06/19/2018   Health Maintenance reviewed - declines shingles, does not want COVID vaccination.   1. Preventative health care We talked about starting exercise routine.   2. Fatty liver Going to work on healthy eating, weight loss which we discussed will help with this.  - Comprehensive metabolic panel; Future  3. Gastroesophageal reflux disease, unspecified whether esophagitis present Continue with prilosec; avoiding food triggers.   4. B12 deficiency Supplementing; uncertain dose but we can confirm once we get lb reuslts.  - CBC with Differential/Platelet; Future - Vitamin B12; Future - Folate; Future  5. Lipid screening - Lipid panel; Future   Return for physical exam.  Micheline Rough, MD

## 2020-08-07 NOTE — Addendum Note (Signed)
Addended by: Amanda Cockayne on: 08/07/2020 11:54 AM   Modules accepted: Orders

## 2020-08-18 ENCOUNTER — Telehealth: Payer: Self-pay | Admitting: *Deleted

## 2020-08-18 NOTE — Addendum Note (Signed)
Addended by: Agnes Lawrence on: 08/18/2020 02:23 PM   Modules accepted: Orders

## 2020-08-18 NOTE — Telephone Encounter (Signed)
-----   Message from Caren Macadam, MD sent at 08/16/2020 12:54 PM EDT ----- Please adjust in HM in patient chart that repeat colonoscopy wil be 5 years from her previous due to updated family history. Let her know that Dr. Havery Moros has adjusted their records as well for this.   Micheline Rough, MD   ----- Message ----- From: Yetta Flock, MD Sent: 08/07/2020   4:51 PM EDT To: Caren Macadam, MD, Roetta Sessions, CMA  Hi. Thanks for the update. Given she has 2 first degree relatives with colon cancer, yes I would recommend we do her next exam 5 years from her last. We will adjust it in our system, thanks for letting me know.   Jan, can you adjust this patient's recall and change it to 5 years from her last colonoscopy, rather than 10? Thanks  Richardson Landry ----- Message ----- From: Caren Macadam, MD Sent: 08/07/2020  11:10 AM EDT To: Yetta Flock, MD  This patient just had brother 53 who was diagnosed with stage 3 colon cancer; thought with history of colon ca in father as well this may warrant earlier colonoscopy? Just fyi :) thanks!

## 2020-08-18 NOTE — Telephone Encounter (Signed)
Patient informed of the message below and modifier changed in health maintenance.

## 2020-08-23 ENCOUNTER — Other Ambulatory Visit: Payer: Self-pay | Admitting: Family Medicine

## 2020-08-23 DIAGNOSIS — R7309 Other abnormal glucose: Secondary | ICD-10-CM

## 2020-08-23 DIAGNOSIS — R748 Abnormal levels of other serum enzymes: Secondary | ICD-10-CM

## 2020-09-07 DIAGNOSIS — Z800 Family history of malignant neoplasm of digestive organs: Secondary | ICD-10-CM | POA: Diagnosis not present

## 2020-09-07 DIAGNOSIS — Z1231 Encounter for screening mammogram for malignant neoplasm of breast: Secondary | ICD-10-CM | POA: Diagnosis not present

## 2020-09-07 DIAGNOSIS — Z130 Encounter for screening for diseases of the blood and blood-forming organs and certain disorders involving the immune mechanism: Secondary | ICD-10-CM | POA: Diagnosis not present

## 2020-12-01 DIAGNOSIS — H2513 Age-related nuclear cataract, bilateral: Secondary | ICD-10-CM | POA: Diagnosis not present

## 2020-12-21 ENCOUNTER — Telehealth: Payer: Self-pay | Admitting: Family Medicine

## 2020-12-21 NOTE — Telephone Encounter (Signed)
Spoke with the patient and informed her a visit is needed for evaluation.  Patient stated she feels like she needs to have labs only to repat enzymes and she does not think she has Covid.  Patient was advised the only way to be sure she does not have Covid is to take a test and a lab appt is scheduled in January 2023 as labs were ordered by PCP to be completed at that time.  Appt scheduled with Dr Maudie Mercury on 11/8 as PCP does not have any openings soon and there are no openings available with another provider today.

## 2020-12-21 NOTE — Telephone Encounter (Signed)
Patient states she is becoming nauseous and throwing up randomly, after she vomits she feels fine. Patient states it is mostly what she ate but on time it was yellow looking saliva. Patient states it has happened four times so far and wants to know what the suggestion is. Patient is aware that she has to test enzymes again but hasn't scheduled appointment yet. Patient has refused appointment for now as she just wants message sent back first.     Good callback number is [phone]      Please advise

## 2020-12-22 ENCOUNTER — Telehealth (INDEPENDENT_AMBULATORY_CARE_PROVIDER_SITE_OTHER): Payer: Medicare Other | Admitting: Family Medicine

## 2020-12-22 ENCOUNTER — Encounter: Payer: Self-pay | Admitting: Family Medicine

## 2020-12-22 DIAGNOSIS — R112 Nausea with vomiting, unspecified: Secondary | ICD-10-CM | POA: Diagnosis not present

## 2020-12-22 NOTE — Progress Notes (Signed)
Virtual Visit via Video Note  I connected with Patricia Burns  on 12/22/20 at 11:20 AM EST by a video enabled telemedicine application and verified that I am speaking with the correct person using two identifiers.  Location patient: home, [place] Location provider:work or home office Persons participating in the virtual visit: patient, provider  I discussed the limitations of evaluation and management by telemedicine and the availability of in person appointments. The patient expressed understanding and agreed to proceed.   HPI:  Acute telemedicine visit for Nausea/Vomiting: -Onset: last week -Symptoms include: nausea and vomiting once, she had eaten a lot of butter fingers - when has a lot of dairy seems to upset her stomach -she has noticed that several times this year when she eats heavy dairy/cream - she gets nausea and sometimes a bout of emesis at night -Denies: fevers, weight loss, malaise, change in bowels, melena, hematochezia -Has tried:nothing -Pertinent past medical history: see below -had colonoscopy a few years ago, had [place] in 2021  ROS: See pertinent positives and negatives per HPI.  Past Medical History:  Diagnosis Date   Anxiety    Arthritis    GERD (gastroesophageal reflux disease)    History of chicken pox    Osteopenia     Past Surgical History:  Procedure Laterality Date   CESAREAN SECTION     x2-1983, 1984   REPLACEMENT TOTAL KNEE Left    TONSILLECTOMY     TOTAL HIP ARTHROPLASTY Right      Current Outpatient Medications:    Ascorbic Acid (VITAMIN C PO), Take 2 tablets by mouth daily., Disp: , Rfl:    CALCIUM PO, Take by mouth daily., Disp: , Rfl:    cetirizine (ZYRTEC) 10 MG tablet, Take 10 mg by mouth as needed., Disp: , Rfl:    Cholecalciferol (VITAMIN D3) 1000 units CAPS, Take 1 capsule by mouth daily., Disp: , Rfl:    fluticasone (FLONASE) 50 MCG/ACT nasal spray, Place 1 spray into both nostrils daily., Disp: 16 g, Rfl: 6   GLUCOSAMINE CHONDROITIN COMPLX PO,  Take by mouth daily., Disp: , Rfl:    omeprazole (PRILOSEC) 20 MG capsule, Take 2 capsules (40 mg total) by mouth daily. (Patient taking differently: Take 20 mg by mouth daily.), Disp: , Rfl:    UNABLE TO FIND, calcium, Disp: , Rfl:   Current Facility-Administered Medications:    0.9 %  sodium chloride infusion, 500 mL, Intravenous, Once, [place], Carlota Raspberry, MD  EXAM:  VITALS per patient if applicable:  GENERAL: alert, oriented, appears well and in no acute distress  HEENT: atraumatic, conjunttiva clear, no obvious abnormalities on inspection of external nose and ears  NECK: normal movements of the head and neck  LUNGS: on inspection no signs of respiratory distress, breathing rate appears normal, no obvious gross SOB, gasping or wheezing  CV: no obvious cyanosis  MS: moves all visible extremities without noticeable abnormality  PSYCH/NEURO: pleasant and cooperative, no obvious depression or anxiety, speech and thought processing grossly intact  ASSESSMENT AND PLAN:  Discussed the following assessment and plan:  Nausea and vomiting, unspecified vomiting type  -we discussed possible serious and likely etiologies, options for evaluation and workup, limitations of telemedicine visit vs in person visit, treatment, treatment risks and precautions. Pt is agreeable to treatment via telemedicine at this moment. Query viral, GERD, food sensitivity vs other. Symptoms resolved currently. Advised trial fo dietary changes and follow up inperson with PCP or GI given her PMH and FH. She agrees to schedule. Continue  PPI.   I discussed the assessment and treatment plan with the patient. The patient was provided an opportunity to ask questions and all were answered. The patient agreed with the plan and demonstrated an understanding of the instructions.     Lucretia Kern, DO

## 2020-12-22 NOTE — Patient Instructions (Signed)
-avoid heavy dairy and rich foods  -continue your acid reflux medication  -included some diet information for GERD below  -schedule follow up with your primary care doctor or your gastroenterologist in 2-4 weeks   I hope you are feeling better soon!  Seek in person care promptly if your symptoms worsen, new concerns arise or you are not improving with treatment.  It was nice to meet you today. I help [facility] out with telemedicine visits on Tuesdays and Thursdays and am available for visits on those days. If you have any concerns or questions following this visit please schedule a follow up visit with your Primary Care doctor or seek care at a local urgent care clinic to avoid delays in care.    Food Choices for Gastroesophageal Reflux Disease, Adult When you have gastroesophageal reflux disease (GERD), the foods you eat and your eating habits are very important. Choosing the right foods can help ease your discomfort. Think about working with a food expert (dietitian) to help you make good choices. What are tips for following this plan? Reading food labels Look for foods that are low in saturated fat. Foods that may help with your symptoms include: Foods that have less than 5% of daily value (DV) of fat. Foods that have 0 grams of trans fat. Cooking Do not fry your food. Cook your food by baking, steaming, grilling, or broiling. These are all methods that do not need a lot of fat for cooking. To add flavor, try to use herbs that are low in spice and acidity. Meal planning  Choose healthy foods that are low in fat, such as: Fruits and vegetables. Whole grains. Low-fat dairy products. Lean meats, fish, and poultry. Eat small meals often instead of eating 3 large meals each day. Eat your meals slowly in a place where you are relaxed. Avoid bending over or lying down until 2-3 hours after eating. Limit high-fat foods such as fatty meats or fried foods. Limit your intake of fatty  foods, such as oils, butter, and shortening. Avoid the following as told by your doctor: Foods that cause symptoms. These may be different for different people. Keep a food diary to keep track of foods that cause symptoms. Alcohol. Drinking a lot of liquid with meals. Eating meals during the 2-3 hours before bed. Lifestyle Stay at a healthy weight. Ask your doctor what weight is healthy for you. If you need to lose weight, work with your doctor to do so safely. Exercise for at least 30 minutes on 5 or more days each week, or as told by your doctor. Wear loose-fitting clothes. Do not smoke or use any products that contain nicotine or tobacco. If you need help quitting, ask your doctor. Sleep with the head of your bed higher than your feet. Use a wedge under the mattress or blocks under the bed frame to raise the head of the bed. Chew sugar-free gum after meals. What foods should eat? Eat a healthy, well-balanced diet of fruits, vegetables, whole grains, low-fat dairy products, lean meats, fish, and poultry. Each person is different. Foods that may cause symptoms in one person may not cause any symptoms in another person. Work with your doctor to find foods that are safe for you. The items listed above may not be a complete list of what you can eat and drink. Contact a food expert for more options. What foods should I avoid? Limiting some of these foods may help in managing the symptoms of GERD. Everyone  is different. Talk with a food expert or your doctor to help you find the exact foods to avoid, if any. Fruits Any fruits prepared with added fat. Any fruits that cause symptoms. For some people, this may include citrus fruits, such as oranges, grapefruit, pineapple, and lemons. Vegetables Deep-fried vegetables. [place] fries. Any vegetables prepared with added fat. Any vegetables that cause symptoms. For some people, this may include tomatoes and tomato products, chili peppers, onions and garlic,  and horseradish. Grains Pastries or quick breads with added fat. Meats and other proteins High-fat meats, such as fatty beef or pork, hot dogs, ribs, ham, sausage, salami, and bacon. Fried meat or protein, including fried fish and fried chicken. Nuts and nut butters, in large amounts. Dairy Whole milk and chocolate milk. Sour cream. Cream. Ice cream. Cream cheese. Milkshakes. Fats and oils Butter. Margarine. Shortening. Ghee. Beverages Coffee and tea, with or without caffeine. Carbonated beverages. Sodas. Energy drinks. Fruit juice made with acidic fruits, such as orange or grapefruit. Tomato juice. Alcoholic drinks. Sweets and desserts Chocolate and cocoa. Donuts. Seasonings and condiments Pepper. Peppermint and spearmint. Added salt. Any condiments, herbs, or seasonings that cause symptoms. For some people, this may include curry, hot sauce, or vinegar-based salad dressings. The items listed above may not be a complete list of what you should not eat and drink. Contact a food expert for more options. Questions to ask your doctor Diet and lifestyle changes are often the first steps that are taken to manage symptoms of GERD. If diet and lifestyle changes do not help, talk with your doctor about taking medicines. Where to find more information International [employer] for Gastrointestinal [employer]: aboutgerd.org Summary When you have GERD, food and lifestyle choices are very important in easing your symptoms. Eat small meals often instead of 3 large meals a day. Eat your meals slowly and in a place where you are relaxed. Avoid bending over or lying down until 2-3 hours after eating. Limit high-fat foods such as fatty meats or fried foods. This information is not intended to replace advice given to you by your health care provider. Make sure you discuss any questions you have with your health care provider. Document Revised: 08/12/2019 Document Reviewed: 08/12/2019 Elsevier Patient  Education  [place].

## 2021-02-19 ENCOUNTER — Other Ambulatory Visit: Payer: Medicare Other

## 2021-04-16 ENCOUNTER — Telehealth: Payer: Self-pay | Admitting: Family Medicine

## 2021-04-16 NOTE — Telephone Encounter (Signed)
Pt to reschedule her lab appt but the labs is not in to be reschedule . ?

## 2021-04-16 NOTE — Telephone Encounter (Signed)
Spoke with the patient to schedule a lab appt as previous orders are in the system.  Patient already has a lab appt scheduled on 3/28, which she preferred due to having a Medicare Wellness Visit on the same day. ?

## 2021-05-11 ENCOUNTER — Other Ambulatory Visit: Payer: Medicare PPO

## 2021-06-01 ENCOUNTER — Ambulatory Visit (INDEPENDENT_AMBULATORY_CARE_PROVIDER_SITE_OTHER): Payer: Medicare PPO

## 2021-06-01 ENCOUNTER — Other Ambulatory Visit (INDEPENDENT_AMBULATORY_CARE_PROVIDER_SITE_OTHER): Payer: Medicare PPO

## 2021-06-01 VITALS — BP 118/62 | HR 76 | Temp 98.3°F | Ht 66.5 in | Wt 196.1 lb

## 2021-06-01 DIAGNOSIS — Z0000 Encounter for general adult medical examination without abnormal findings: Secondary | ICD-10-CM | POA: Diagnosis not present

## 2021-06-01 DIAGNOSIS — R7309 Other abnormal glucose: Secondary | ICD-10-CM | POA: Diagnosis not present

## 2021-06-01 DIAGNOSIS — R748 Abnormal levels of other serum enzymes: Secondary | ICD-10-CM

## 2021-06-01 LAB — HEMOGLOBIN A1C: Hgb A1c MFr Bld: 5.7 % (ref 4.6–6.5)

## 2021-06-01 LAB — CBC WITH DIFFERENTIAL/PLATELET
Basophils Absolute: 0 10*3/uL (ref 0.0–0.1)
Basophils Relative: 0.8 % (ref 0.0–3.0)
Eosinophils Absolute: 0.2 10*3/uL (ref 0.0–0.7)
Eosinophils Relative: 3.5 % (ref 0.0–5.0)
HCT: 42.7 % (ref 36.0–46.0)
Hemoglobin: 14.5 g/dL (ref 12.0–15.0)
Lymphocytes Relative: 34.8 % (ref 12.0–46.0)
Lymphs Abs: 2 10*3/uL (ref 0.7–4.0)
MCHC: 34 g/dL (ref 30.0–36.0)
MCV: 95.1 fl (ref 78.0–100.0)
Monocytes Absolute: 0.6 10*3/uL (ref 0.1–1.0)
Monocytes Relative: 9.7 % (ref 3.0–12.0)
Neutro Abs: 3 10*3/uL (ref 1.4–7.7)
Neutrophils Relative %: 51.2 % (ref 43.0–77.0)
Platelets: 182 10*3/uL (ref 150.0–400.0)
RBC: 4.49 Mil/uL (ref 3.87–5.11)
RDW: 13.4 % (ref 11.5–15.5)
WBC: 5.9 10*3/uL (ref 4.0–10.5)

## 2021-06-01 LAB — HEPATIC FUNCTION PANEL
ALT: 88 U/L — ABNORMAL HIGH (ref 0–35)
AST: 75 U/L — ABNORMAL HIGH (ref 0–37)
Albumin: 4.2 g/dL (ref 3.5–5.2)
Alkaline Phosphatase: 73 U/L (ref 39–117)
Bilirubin, Direct: 0.1 mg/dL (ref 0.0–0.3)
Total Bilirubin: 0.5 mg/dL (ref 0.2–1.2)
Total Protein: 7.5 g/dL (ref 6.0–8.3)

## 2021-06-01 LAB — VITAMIN B12: Vitamin B-12: 603 pg/mL (ref 211–911)

## 2021-06-01 NOTE — Patient Instructions (Signed)
Patricia Burns , ?Thank you for taking time to come for your Medicare Wellness Visit. I appreciate your ongoing commitment to your health goals. Please review the following plan we discussed and let me know if I can assist you in the future.  ? ?Screening recommendations/referrals: ?Colonoscopy: completed 04/06/2017, due 04/06/2022 ?Mammogram: due ?Bone Density: completed 01/26/2017 ?Recommended yearly ophthalmology/optometry visit for glaucoma screening and checkup ?Recommended yearly dental visit for hygiene and checkup ? ?Vaccinations: ?Influenza vaccine: decline ?Pneumococcal vaccine: decline ?Tdap vaccine: completed 01/16/2018, due 01/17/2028 ?Shingles vaccine: decline   ?Covid-19:decline ? ?Advanced directives: Advance directive discussed with you today. Even though you declined this today please call our office should you change your mind and we can give you the proper paperwork for you to fill out. ? ?Conditions/risks identified: none ? ?Next appointment: Follow up in one year for your annual wellness visit  ? ? ?Preventive Care 44 Years and Older, Female ?Preventive care refers to lifestyle choices and visits with your health care provider that can promote health and wellness. ?What does preventive care include? ?A yearly physical exam. This is also called an annual well check. ?Dental exams once or twice a year. ?Routine eye exams. Ask your health care provider how often you should have your eyes checked. ?Personal lifestyle choices, including: ?Daily care of your teeth and gums. ?Regular physical activity. ?Eating a healthy diet. ?Avoiding tobacco and drug use. ?Limiting alcohol use. ?Practicing safe sex. ?Taking low-dose aspirin every day. ?Taking vitamin and mineral supplements as recommended by your health care provider. ?What happens during an annual well check? ?The services and screenings done by your health care provider during your annual well check will depend on your age, overall health, lifestyle risk  factors, and family history of disease. ?Counseling  ?Your health care provider may ask you questions about your: ?Alcohol use. ?Tobacco use. ?Drug use. ?Emotional well-being. ?Home and relationship well-being. ?Sexual activity. ?Eating habits. ?History of falls. ?Memory and ability to understand (cognition). ?Work and work Statistician. ?Reproductive health. ?Screening  ?You may have the following tests or measurements: ?Height, weight, and BMI. ?Blood pressure. ?Lipid and cholesterol levels. These may be checked every 5 years, or more frequently if you are over 28 years old. ?Skin check. ?Lung cancer screening. You may have this screening every year starting at age 39 if you have a 30-pack-year history of smoking and currently smoke or have quit within the past 15 years. ?Fecal occult blood test (FOBT) of the stool. You may have this test every year starting at age 75. ?Flexible sigmoidoscopy or colonoscopy. You may have a sigmoidoscopy every 5 years or a colonoscopy every 10 years starting at age 43. ?Hepatitis C blood test. ?Hepatitis B blood test. ?Sexually transmitted disease (STD) testing. ?Diabetes screening. This is done by checking your blood sugar (glucose) after you have not eaten for a while (fasting). You may have this done every 1-3 years. ?Bone density scan. This is done to screen for osteoporosis. You may have this done starting at age 89. ?Mammogram. This may be done every 1-2 years. Talk to your health care provider about how often you should have regular mammograms. ?Talk with your health care provider about your test results, treatment options, and if necessary, the need for more tests. ?Vaccines  ?Your health care provider may recommend certain vaccines, such as: ?Influenza vaccine. This is recommended every year. ?Tetanus, diphtheria, and acellular pertussis (Tdap, Td) vaccine. You may need a Td booster every 10 years. ?Zoster vaccine. You may  need this after age 52. ?Pneumococcal 13-valent  conjugate (PCV13) vaccine. One dose is recommended after age 23. ?Pneumococcal polysaccharide (PPSV23) vaccine. One dose is recommended after age 46. ?Talk to your health care provider about which screenings and vaccines you need and how often you need them. ?This information is not intended to replace advice given to you by your health care provider. Make sure you discuss any questions you have with your health care provider. ?Document Released: 02/27/2015 Document Revised: 10/21/2015 Document Reviewed: 12/02/2014 ?Elsevier Interactive Patient Education ? 2017 [place]. ? ?Fall Prevention in the Home ?Falls can cause injuries. They can happen to people of all ages. There are many things you can do to make your home safe and to help prevent falls. ?What can I do on the outside of my home? ?Regularly fix the edges of walkways and driveways and fix any cracks. ?Remove anything that might make you trip as you walk through a door, such as a raised step or threshold. ?Trim any bushes or trees on the path to your home. ?Use bright outdoor lighting. ?Clear any walking paths of anything that might make someone trip, such as rocks or tools. ?Regularly check to see if handrails are loose or broken. Make sure that both sides of any steps have handrails. ?Any raised decks and porches should have guardrails on the edges. ?Have any leaves, snow, or ice cleared regularly. ?Use sand or salt on walking paths during winter. ?Clean up any spills in your garage right away. This includes oil or grease spills. ?What can I do in the bathroom? ?Use night lights. ?Install grab bars by the toilet and in the tub and shower. Do not use towel bars as grab bars. ?Use non-skid mats or decals in the tub or shower. ?If you need to sit down in the shower, use a plastic, non-slip stool. ?Keep the floor dry. Clean up any water that spills on the floor as soon as it happens. ?Remove soap buildup in the tub or shower regularly. ?Attach bath mats  securely with double-sided non-slip rug tape. ?Do not have throw rugs and other things on the floor that can make you trip. ?What can I do in the bedroom? ?Use night lights. ?Make sure that you have a light by your bed that is easy to reach. ?Do not use any sheets or blankets that are too big for your bed. They should not hang down onto the floor. ?Have a firm chair that has side arms. You can use this for support while you get dressed. ?Do not have throw rugs and other things on the floor that can make you trip. ?What can I do in the kitchen? ?Clean up any spills right away. ?Avoid walking on wet floors. ?Keep items that you use a lot in easy-to-reach places. ?If you need to reach something above you, use a strong step stool that has a grab bar. ?Keep electrical cords out of the way. ?Do not use floor polish or wax that makes floors slippery. If you must use wax, use non-skid floor wax. ?Do not have throw rugs and other things on the floor that can make you trip. ?What can I do with my stairs? ?Do not leave any items on the stairs. ?Make sure that there are handrails on both sides of the stairs and use them. Fix handrails that are broken or loose. Make sure that handrails are as long as the stairways. ?Check any carpeting to make sure that it is firmly attached  to the stairs. Fix any carpet that is loose or worn. ?Avoid having throw rugs at the top or bottom of the stairs. If you do have throw rugs, attach them to the floor with carpet tape. ?Make sure that you have a light switch at the top of the stairs and the bottom of the stairs. If you do not have them, ask someone to add them for you. ?What else can I do to help prevent falls? ?Wear shoes that: ?Do not have high heels. ?Have rubber bottoms. ?Are comfortable and fit you well. ?Are closed at the toe. Do not wear sandals. ?If you use a stepladder: ?Make sure that it is fully opened. Do not climb a closed stepladder. ?Make sure that both sides of the stepladder  are locked into place. ?Ask someone to hold it for you, if possible. ?Clearly mark and make sure that you can see: ?Any grab bars or handrails. ?First and last steps. ?Where the edge of each step is. ?Use t

## 2021-06-01 NOTE — Progress Notes (Signed)
?This visit occurred during the SARS-CoV-2 public health emergency.  Safety protocols were in place, including screening questions prior to the visit, additional usage of staff PPE, and extensive cleaning of exam room while observing appropriate contact time as indicated for disinfecting solutions. ? ?Subjective:  ? Patricia Burns is a 66 y.o. female who presents for Medicare Annual (Subsequent) preventive examination. ? ?Review of Systems    ? ?Cardiac Risk Factors include: advanced age (>7mn, >>71women);obesity (BMI >30kg/m2) ? ?   ?Objective:  ?  ?Today's Vitals  ? 06/01/21 1041 06/01/21 1047  ?BP: 118/62   ?Pulse: 76   ?Temp: 98.3 ?F (36.8 ?C)   ?TempSrc: Oral   ?SpO2: 96%   ?Weight: 196 lb 1.6 oz (89 kg)   ?Height: 5' 6.5" (1.689 m)   ?PainSc:  6   ? ?Body mass index is 31.18 kg/m?. ? ? ?  06/01/2021  ? 10:53 AM 05/20/2020  ?  1:31 PM 04/06/2017  ? 10:06 AM  ?Advanced Directives  ?Does Patient Have a Medical Advance Directive? No No No  ?Would patient like information on creating a medical advance directive? No - Patient declined No - Patient declined No - Patient declined  ? ? ?Current Medications (verified) ?Outpatient Encounter Medications as of 06/01/2021  ?Medication Sig  ? CALCIUM PO Take by mouth daily.  ? cetirizine (ZYRTEC) 10 MG tablet Take 10 mg by mouth as needed.  ? Cholecalciferol (VITAMIN D3) 1000 units CAPS Take 1 capsule by mouth daily.  ? fluticasone (FLONASE) 50 MCG/ACT nasal spray Place 1 spray into both nostrils daily. (Patient taking differently: Place 1 spray into both nostrils daily as needed.)  ? omeprazole (PRILOSEC) 20 MG capsule Take 2 capsules (40 mg total) by mouth daily. (Patient taking differently: Take 20 mg by mouth daily.)  ? Ascorbic Acid (VITAMIN C PO) Take 2 tablets by mouth daily. (Patient not taking: Reported on 06/01/2021)  ? GLUCOSAMINE CHONDROITIN COMPLX PO Take by mouth daily. (Patient not taking: Reported on 06/01/2021)  ? UNABLE TO FIND calcium  ? ?Facility-Administered  Encounter Medications as of 06/01/2021  ?Medication  ? 0.9 %  sodium chloride infusion  ? ? ?Allergies (verified) ?Other and Amoxicillin  ? ?History: ?Past Medical History:  ?Diagnosis Date  ? Anxiety   ? Arthritis   ? GERD (gastroesophageal reflux disease)   ? History of chicken pox   ? Osteopenia   ? ?Past Surgical History:  ?Procedure Laterality Date  ? CESAREAN SECTION    ? x2-1983, 1984  ? REPLACEMENT TOTAL KNEE Left   ? TONSILLECTOMY    ? TOTAL HIP ARTHROPLASTY Right   ? ?Family History  ?Problem Relation Age of Onset  ? Arthritis Mother   ? Diabetes Father   ? Arthritis Father   ? Prostate cancer Father   ? Colon cancer Father   ? Cirrhosis Father   ?     alcohol related  ? Crohn's disease Sister   ?     1/2 sister; died from surgical complications  ? Heart disease Paternal Grandmother   ? Other Paternal Grandmother   ?     brain tumor  ? Heart disease Paternal Grandfather   ? Colon cancer Paternal Aunt   ? Colon cancer Brother   ? ?Social History  ? ?Socioeconomic History  ? Marital status: Divorced  ?  Spouse name: Not on file  ? Number of children: Not on file  ? Years of education: Not on file  ? Highest  education level: Not on file  ?Occupational History  ? Not on file  ?Tobacco Use  ? Smoking status: Former  ? Smokeless tobacco: Never  ?Vaping Use  ? Vaping Use: Never used  ?Substance and Sexual Activity  ? Alcohol use: Yes  ?  Alcohol/week: 1.0 - 2.0 standard drink  ?  Types: 1 - 2 Glasses of wine per week  ?  Comment: 1-2 drinks daily   ? Drug use: No  ? Sexual activity: Not on file  ?Other Topics Concern  ? Not on file  ?Social History Narrative  ? Work or School: Electrical engineer  ?   ? Home Situation: lives with duaghter  ?   ? Spiritual Beliefs: Christian  ?   ? Lifestyle: no regular exercise; diet is ok  ?   ?   ?   ? ?Social Determinants of Health  ? ?Financial Resource Strain: Low Risk   ? Difficulty of Paying Living Expenses: Not hard at all  ?Food Insecurity: No Food Insecurity  ? Worried About  Charity fundraiser in the Last Year: Never true  ? Ran Out of Food in the Last Year: Never true  ?Transportation Needs: No Transportation Needs  ? Lack of Transportation (Medical): No  ? Lack of Transportation (Non-Medical): No  ?Physical Activity: Inactive  ? Days of Exercise per Week: 0 days  ? Minutes of Exercise per Session: 0 min  ?Stress: No Stress Concern Present  ? Feeling of Stress : Only a little  ?Social Connections: Not on file  ? ? ?Tobacco Counseling ?Counseling given: Not Answered ? ? ?Clinical Intake: ? ?Pre-visit preparation completed: Yes ? ?Pain : 0-10 ?Pain Score: 6  ?Pain Type: Chronic pain ?Pain Location: Generalized ?Pain Descriptors / Indicators: Aching ?Pain Onset: More than a month ago ?Pain Frequency: Constant ?Pain Relieving Factors: Advil helps ? ?Pain Relieving Factors: Advil helps ? ?Nutritional Status: BMI > 30  Obese ?Nutritional Risks: None ?Diabetes: No ? ?How often do you need to have someone help you when you read instructions, pamphlets, or other written materials from your doctor or pharmacy?: 1 - Never ?What is the last grade level you completed in school?: GED ? ?Diabetic? no ? ?Interpreter Needed?: No ? ?Information entered by :: NAllen LPN ? ? ?Activities of Daily Living ? ?  06/01/2021  ? 10:54 AM  ?In your present state of health, do you have any difficulty performing the following activities:  ?Hearing? 0  ?Vision? 0  ?Difficulty concentrating or making decisions? 1  ?Walking or climbing stairs? 0  ?Dressing or bathing? 0  ?Doing errands, shopping? 0  ?Preparing Food and eating ? N  ?Using the Toilet? N  ?In the past six months, have you accidently leaked urine? N  ?Do you have problems with loss of bowel control? N  ?Managing your Medications? N  ?Managing your Finances? N  ?Housekeeping or managing your Housekeeping? N  ? ? ?Patient Care Team: ?Caren Macadam, MD as PCP - General (Family Medicine) ?Newton Pigg, MD as PCP - OBGYN (Obstetrics and  Gynecology) ?Caren Macadam, MD as Consulting Physician (Family Medicine) ? ?Indicate any recent Medical Services you may have received from other than [facility] providers in the past year (date may be approximate). ? ?   ?Assessment:  ? This is a routine wellness examination for Patricia Burns. ? ?Hearing/Vision screen ?Vision Screening - Comments:: Regular eye exams, [facility] ? ?Dietary issues and exercise activities discussed: ?Current Exercise Habits: The  patient does not participate in regular exercise at present ? ? Goals Addressed   ? ?  ?  ?  ?  ? This Visit's Progress  ?  Patient Stated     ?  06/01/2021, wants to lose weight ?  ? ?  ? ?Depression Screen ? ?  06/01/2021  ? 10:54 AM 05/20/2020  ?  1:34 PM 05/20/2020  ?  1:28 PM 07/17/2019  ?  1:31 PM 03/12/2018  ? 11:01 AM 01/16/2018  ? 11:08 AM  ?PHQ 2/9 Scores  ?PHQ - 2 Score 0 0 0 0 3 0  ?PHQ- 9 Score     5 2  ?  ?Fall Risk ? ?  06/01/2021  ? 10:54 AM 05/20/2020  ?  1:32 PM  ?Fall Risk   ?Falls in the past year? 0 0  ?Number falls in past yr: 0 0  ?Injury with Fall? 0 0  ?Risk for fall due to : No Fall Risks   ?Follow up Falls evaluation completed;Education provided;Falls prevention discussed Falls evaluation completed  ? ? ?FALL RISK PREVENTION PERTAINING TO THE HOME: ? ?Any stairs in or around the home? Yes  ?If so, are there any without handrails? N/a ?Home free of loose throw rugs in walkways, pet beds, electrical cords, etc? Yes  ?Adequate lighting in your home to reduce risk of falls? Yes  ? ?ASSISTIVE DEVICES UTILIZED TO PREVENT FALLS: ? ?Life alert? No  ?Use of a cane, walker or w/c? No  ?Grab bars in the bathroom? No  ?Shower chair or bench in shower? No  ?Elevated toilet seat or a handicapped toilet? No  ? ?TIMED UP AND GO: ? ?Was the test performed? No .  ?  ? ?Gait steady and fast without use of assistive device ? ?Cognitive Function: ?  ?  ? ?  06/01/2021  ? 10:56 AM  ?6CIT Screen  ?What Year? 0 points  ?What month? 0 points  ?What time? 0 points  ?Count back  from 20 0 points  ?Months in reverse 0 points  ?Repeat phrase 8 points  ?Total Score 8 points  ? ? ?Immunizations ?Immunization History  ?Administered Date(s) Administered  ? Influenza,inj,Quad PF,6+ Mos 03/24/2016, 1

## 2021-07-06 ENCOUNTER — Emergency Department (HOSPITAL_COMMUNITY)
Admission: EM | Admit: 2021-07-06 | Discharge: 2021-07-06 | Disposition: A | Discharge status: Home / Self Care | Payer: Medicare PPO | Attending: Emergency Medicine | Admitting: Emergency Medicine

## 2021-07-06 ENCOUNTER — Encounter (HOSPITAL_COMMUNITY): Payer: Self-pay | Admitting: Pharmacy Technician

## 2021-07-06 ENCOUNTER — Other Ambulatory Visit: Payer: Self-pay

## 2021-07-06 ENCOUNTER — Emergency Department (HOSPITAL_COMMUNITY): Payer: Medicare PPO

## 2021-07-06 ENCOUNTER — Telehealth: Payer: Self-pay

## 2021-07-06 DIAGNOSIS — R29810 Facial weakness: Secondary | ICD-10-CM | POA: Diagnosis not present

## 2021-07-06 DIAGNOSIS — R202 Paresthesia of skin: Secondary | ICD-10-CM

## 2021-07-06 DIAGNOSIS — R200 Anesthesia of skin: Secondary | ICD-10-CM | POA: Diagnosis not present

## 2021-07-06 DIAGNOSIS — R531 Weakness: Secondary | ICD-10-CM | POA: Insufficient documentation

## 2021-07-06 DIAGNOSIS — Q670 Congenital facial asymmetry: Secondary | ICD-10-CM

## 2021-07-06 DIAGNOSIS — R42 Dizziness and giddiness: Secondary | ICD-10-CM | POA: Insufficient documentation

## 2021-07-06 LAB — DIFFERENTIAL
Abs Immature Granulocytes: 0.05 10*3/uL (ref 0.00–0.07)
Basophils Absolute: 0.1 10*3/uL (ref 0.0–0.1)
Basophils Relative: 1 %
Eosinophils Absolute: 0.2 10*3/uL (ref 0.0–0.5)
Eosinophils Relative: 2 %
Immature Granulocytes: 1 %
Lymphocytes Relative: 26 %
Lymphs Abs: 2.7 10*3/uL (ref 0.7–4.0)
Monocytes Absolute: 0.7 10*3/uL (ref 0.1–1.0)
Monocytes Relative: 7 %
Neutro Abs: 6.6 10*3/uL (ref 1.7–7.7)
Neutrophils Relative %: 63 %

## 2021-07-06 LAB — CBC
HCT: 48 % — ABNORMAL HIGH (ref 36.0–46.0)
Hemoglobin: 16.7 g/dL — ABNORMAL HIGH (ref 12.0–15.0)
MCH: 32.8 pg (ref 26.0–34.0)
MCHC: 34.8 g/dL (ref 30.0–36.0)
MCV: 94.3 fL (ref 80.0–100.0)
Platelets: 222 10*3/uL (ref 150–400)
RBC: 5.09 MIL/uL (ref 3.87–5.11)
RDW: 12.2 % (ref 11.5–15.5)
WBC: 10.3 10*3/uL (ref 4.0–10.5)
nRBC: 0 % (ref 0.0–0.2)

## 2021-07-06 LAB — RAPID URINE DRUG SCREEN, HOSP PERFORMED
Amphetamines: NOT DETECTED
Barbiturates: NOT DETECTED
Benzodiazepines: NOT DETECTED
Cocaine: NOT DETECTED
Opiates: NOT DETECTED
Tetrahydrocannabinol: NOT DETECTED

## 2021-07-06 LAB — COMPREHENSIVE METABOLIC PANEL
ALT: 111 U/L — ABNORMAL HIGH (ref 0–44)
AST: 102 U/L — ABNORMAL HIGH (ref 15–41)
Albumin: 4.5 g/dL (ref 3.5–5.0)
Alkaline Phosphatase: 96 U/L (ref 38–126)
Anion gap: 9 (ref 5–15)
BUN: 12 mg/dL (ref 8–23)
CO2: 24 mmol/L (ref 22–32)
Calcium: 9.6 mg/dL (ref 8.9–10.3)
Chloride: 106 mmol/L (ref 98–111)
Creatinine, Ser: 0.84 mg/dL (ref 0.44–1.00)
GFR, Estimated: >60 mL/min (ref >60)
Glucose, Bld: 127 mg/dL — ABNORMAL HIGH (ref 70–99)
Potassium: 3.9 mmol/L (ref 3.5–5.1)
Sodium: 139 mmol/L (ref 135–145)
Total Bilirubin: 0.7 mg/dL (ref 0.3–1.2)
Total Protein: 8.5 g/dL — ABNORMAL HIGH (ref 6.5–8.1)

## 2021-07-06 LAB — PROTIME-INR
INR: 1 (ref 0.8–1.2)
Prothrombin Time: 12.8 seconds (ref 11.4–15.2)

## 2021-07-06 LAB — URINALYSIS, ROUTINE W REFLEX MICROSCOPIC
Bilirubin Urine: NEGATIVE
Glucose, UA: NEGATIVE mg/dL
Ketones, ur: NEGATIVE mg/dL
Leukocytes,Ua: NEGATIVE
Nitrite: NEGATIVE
Protein, ur: NEGATIVE mg/dL
Specific Gravity, Urine: 1.006 (ref 1.005–1.030)
pH: 6 (ref 5.0–8.0)

## 2021-07-06 LAB — APTT: aPTT: 43 seconds — ABNORMAL HIGH (ref 24–36)

## 2021-07-06 LAB — ETHANOL: Alcohol, Ethyl (B): <10 mg/dL (ref <10)

## 2021-07-06 NOTE — ED Triage Notes (Signed)
Pt here with reports of having bilateral lower extremity numbness for the last few days (hx sciatica and bulging disc). States this happens occasionally. Pt states yesterday she had L facial numbness. L facial droop noted, pt states she looks no different than normal. No unilateral weakness noted.

## 2021-07-06 NOTE — ED Provider Notes (Signed)
  I assumed care of patient at shift change from previous team, please see their note for full H&P. Briefly patient is here for multiple symptoms including intermittent paresthesias of her bilateral arms and legs and now for concern of worsening weakness of the left-sided lower face for over 24 hours. When I speak with patient she tells me that she has looked at herself multiple times and does not feel like that she has any new weakness.  She has chronic weakness that has been there for over a year. She currently reports she is not having any facial numbness or paresthesias.  No paresthesias or numbness in her bilateral arms and legs, and on further clarification when she had previously been using the term numbness she was using this to describe paresthesias.  She does not have any significant new pain in her chest, back, abdomen and aside from a questionable left-sided facial droop at the time of my evaluation is asymptomatic.  2+ radial, DP/PT pulses bilaterally.  5/5 strength bilateral arms and legs.  Coordination is intact on my exam.  Pupils are equal round reactive to light.  No nystagmus.  The left-sided face has full range of motion of the eyebrows and eyes.  Range of motion on the left-sided face is limited to the area of the smile/mouth on the left side.  Left-sided smile does not elevate as far as on the right sided.  Normal phonation without slurred speech or aphasia.    Physical Exam  BP 112/77   Pulse 80   Temp 97.9 F (36.6 C)   Resp 16   LMP 09/02/2013   SpO2 96%    Procedures  Procedures    ED Course / MDM    Medical Decision Making Labs were obtained.  Hemoglobin is slightly elevated.  PT/INR is normal.  CMP shows mild transaminitis.  This is already been flagged by the PCP and she is scheduled for a repeat exam.  Urine does have small blood in right area, patient is not having significant urinary symptoms however urine culture is ordered.  MRI brain resulted with  left maxillary sinus thickening but is minimal. Patient is not having pain in this area I do not think that this is causing facial droop or patient's intermittent body paresthesias.  Discussed this result with the patient, recommended following up with her PCP. She does not have URI-like symptoms.  I recommended that she restart her seasonal allergy medications.  No indication for antibiotics at this time. I did briefly speak with Dr. Quinn Axe of neurology who is in agreement that patient seems appropriate for outpatient follow-up. I discussed this result with patient and her daughter at bedside.  Return precautions were discussed with patient who states their understanding.  At the time of discharge patient denied any unaddressed complaints or concerns.  Patient is agreeable for discharge home.  Note: Portions of this report may have been transcribed using voice recognition software. Every effort was made to ensure accuracy; however, inadvertent computerized transcription errors may be present    Problems Addressed: Facial asymmetry: chronic illness or injury Left facial numbness: acute illness or injury  Amount and/or Complexity of Data Reviewed Labs: ordered. Radiology: ordered. Discussion of management or test interpretation with external provider(s): Neurology  Risk Decision regarding hospitalization.          Lorin Glass, PA-C 07/06/21 2248    Horton, Alvin Critchley, DO 07/07/21 0038

## 2021-07-06 NOTE — ED Provider Notes (Signed)
[facility] DEPT Provider Note   CSN: [id number] Arrival date & time: 07/06/21  1057     History  Chief Complaint  Patient presents with   Numbness    Patricia Burns is a 66 y.o. female with chief complaint of transient numbness and possible facial asymmetry.  Believes she has a history of sciatica and lumbar arthritis, but has never been officially diagnosed.  Notices intermittent periods of lower extremity numbness and weakness, sometimes affecting the upper extremities at the same time.  This is increased over the last few days, especially yesterday and today.  Yesterday she also had a 1 to 2-hour episode of numbness to the left side of her face.  Denies vision changes at that time or since then.  Denies chest pain, shortness of breath, abdominal pain, nausea/vomiting, fevers, recent trauma, or dizziness.  Did note a few episodes of lightheadedness a couple days ago, but believes this is normal and happens to everybody.  No history of vertigo, prior CVAs, prior MIs, prior PEs.  No Hx of HTN, diabetes mellitus, or hyperlipidemia.  Sees PCP every 6 months.  The history is provided by the patient and medical records.      Home Medications Prior to Admission medications   Medication Sig Start Date End Date Taking? Authorizing Provider  CALCIUM PO Take by mouth daily.   Yes [provider]  Cholecalciferol (VITAMIN D3) 1000 units CAPS Take 1 capsule by mouth daily.   Yes [provider]  fluticasone (FLONASE) 50 MCG/ACT nasal spray Place 1 spray into both nostrils daily. Patient taking differently: Place 1 spray into both nostrils daily as needed. 01/16/18  Yes Lucretia Kern, DO  omeprazole (PRILOSEC) 20 MG capsule Take 2 capsules (40 mg total) by mouth daily. Patient taking differently: Take 20 mg by mouth daily. 07/17/19  Yes Caren Macadam, MD      Allergies    Other and Amoxicillin    Review of Systems   Review of Systems   Neurological:  Positive for weakness, light-headedness and numbness.   Physical Exam Updated Vital Signs BP 133/78   Pulse 97   Temp (!) 97.5 F (36.4 C) (Oral)   Resp 16   LMP 09/02/2013   SpO2 96%  Physical Exam Vitals and nursing note reviewed.  Constitutional:      General: She is not in acute distress.    Appearance: Normal appearance. She is well-developed. She is not ill-appearing or diaphoretic.  HENT:     Head: Normocephalic and atraumatic.  Eyes:     General: Lids are normal. Vision grossly intact. Gaze aligned appropriately. No visual field deficit or scleral icterus.    Extraocular Movements: Extraocular movements intact.     Right eye: No nystagmus.     Left eye: No nystagmus.     Conjunctiva/sclera: Conjunctivae normal.     Pupils: Pupils are equal, round, and reactive to light.     Visual Fields: Right eye visual fields normal and left eye visual fields normal.  Cardiovascular:     Rate and Rhythm: Normal rate and regular rhythm.     Pulses: Normal pulses.          Radial pulses are 2+ on the right side and 2+ on the left side.       Dorsalis pedis pulses are 2+ on the right side and 2+ on the left side.       Posterior tibial pulses are 2+ on the right  side and 2+ on the left side.     Heart sounds: Normal heart sounds. No murmur heard.    Comments: Sinus arrhythmia appreciated Pulmonary:     Effort: Pulmonary effort is normal. No tachypnea or respiratory distress.     Breath sounds: Normal breath sounds. No decreased air movement. No wheezing.  Chest:     Chest wall: No tenderness.  Abdominal:     Palpations: Abdomen is soft.     Tenderness: There is no abdominal tenderness.  Musculoskeletal:        General: No swelling.     Cervical back: Neck supple. No rigidity or tenderness.     Right lower leg: No edema.     Left lower leg: No edema.  Skin:    General: Skin is warm and dry.     Capillary Refill: Capillary refill takes less than 2 seconds.      Coloration: Skin is not jaundiced or pale.  Neurological:     General: No focal deficit present.     Mental Status: She is alert and oriented to person, place, and time.     GCS: GCS eye subscore is 4. GCS verbal subscore is 5. GCS motor subscore is 6.     Cranial Nerves: Facial asymmetry present. No cranial nerve deficit or dysarthria.     Sensory: No sensory deficit.     Motor: No weakness, tremor, seizure activity or pronator drift.     Coordination: Coordination normal. Finger-Nose-Finger Test and Heel to [facility] Test normal.     Gait: Gait normal.     Comments: Mild facial asymmetry of the left side, which patient states is baseline.  Trigeminal nerve equal in bilateral.  Gait, coordination, motor function, sensation appear grossly intact.  Normal EOMs.  PERRLA.  No unilateral weakness appreciated.  Psychiatric:        Mood and Affect: Mood normal.    ED Results / Procedures / Treatments   Labs (all labs ordered are listed, but only abnormal results are displayed) Labs Reviewed    EKG EKG Interpretation  Date/Time:  Tuesday Jul 06 2021 11:06:43 EDT Ventricular Rate:  91 PR Interval:  142 QRS Duration: 102 QT Interval:  378 QTC Calculation: 466 R Axis:   76 Text Interpretation: Sinus arrhythmia Ventricular premature complex Low voltage, precordial leads RSR' in V1 or V2, right VCD or RVH No old tracing to compare Confirmed by Deno Etienne [phone]) on 07/06/2021 11:12:13 AM  Radiology CT Head Wo Contrast  Result Date: 07/06/2021 CLINICAL DATA:  Transient ischemic attack. Neurological deficit, acute, stroke suspected. Bilateral lower extremity numbness over the last several days. EXAM: CT HEAD WITHOUT CONTRAST TECHNIQUE: Contiguous axial images were obtained from the base of the skull through the vertex without intravenous contrast. RADIATION DOSE REDUCTION: This exam was performed according to the departmental dose-optimization program which includes automated exposure control,  adjustment of the mA and/or kV according to patient size and/or use of iterative reconstruction technique. COMPARISON:  None Available. FINDINGS: Brain: The brain shows a normal appearance without evidence of malformation, atrophy, old or acute small or large vessel infarction, mass lesion, hemorrhage, hydrocephalus or extra-axial collection. Vascular: No hyperdense vessel. No evidence of atherosclerotic calcification. Skull: Normal.  No traumatic finding.  No focal bone lesion. Sinuses/Orbits: Sinuses are clear. Orbits appear normal. Mastoids are clear. Other: None significant IMPRESSION: Normal head CT. Electronically Signed   By: Nelson Chimes M.D.   On: 07/06/2021 13:33    Procedures Procedures  Medications Ordered in ED Medications - No data to display  ED Course/ Medical Decision Making/ A&P                           Medical Decision Making Amount and/or Complexity of Data Reviewed External Data Reviewed: notes. Labs: ordered. Decision-making details documented in ED Course. Radiology: ordered and independent interpretation performed. Decision-making details documented in ED Course. ECG/medicine tests: ordered and independent interpretation performed. Decision-making details documented in ED Course.  Risk OTC drugs. Prescription drug management.   66 y.o. female presents to the ED for concern of Numbness   This involves an extensive number of treatment options, and is a complaint that carries with it a high risk of complications and morbidity.    Past Medical History / Co-morbidities / Social History: Hx of GERD, anxiety, alcohol use, prior TKA, prior THA, elevated LFTs, age-related nuclear cataract  Additional History:  Internal and external records from outside source obtained and reviewed including family medicine  Physical Exam: Physical exam performed. The pertinent findings include: mild lower left facial droop.  Otherwise neuro exam unremarkable.  Lab Tests: To be  determined by oncoming provider  Imaging Studies: I ordered imaging studies including CT head and MRI brain .  I independently visualized and interpreted said imaging.  Pertinent results include: CT head: Normal head CT, no acute intracranial abnormality. MRI brain: Pending I agree with the radiologist interpretation.  I personally ordered and interpreted EKG 12 lead findings, which showed: Sinus arrhythmia without  Medications: None  ED Course/Disposition: Pt well-appearing on exam.  Complains of a few days of dizziness/lightheadedness without syncope, followed by numbness of all extremities, and transient numbness of left side of face.  Possible left facial droop, appreciated per patient's daughter as well on exam.  No active weakness, discoordination, or abnormal gait.  Neuro exam otherwise unremarkable as described above.  Also complains of intermittent numbness of the left hand/fingers she attributes to arthritis.  Hx of HNP of lumbar spine in unspecified region per pt.  No recent trauma, LOC, or syncope.  EKG indicates sinus arrhythmia.  CT head pending.  1330: CT head negative for acute intracranial abnormality.  Reviewed CT imaging with pt.  MRI of brain ordered for further evaluation for CVA.    1500: Daughter now present in room.  Verbal permission given by pt to discuss treatment plan with daughter.  Discussed possibility of active facial asymmetry today, specifically a mild facial droop of the left lower facial quadrant.  Pt's daughter appreciates this and states this seems "more than her normal", further clarifying that the pt has a "naturally asymmetrical face, but this looks worse than her normal".  Re-reviewed CT imaging results with pt and her daughter.  MRI still pending.    1524: Care of MAEOLA MCHANEY transferred to [facility] and Dr. Tyrone Nine at the end of my shift as the patient will require reassessment once labs/imaging have resulted.  Patient presentation, ED  course, and plan of care discussed with review of all pertinent labs and imaging.  Please see his/her note for further details regarding further ED course and disposition.  Plan at time of handoff is continue with MRI of brain to assess for evidence of possible stroke.  If imaging negative, may likely benefit from overnight hospital observation for possible TIA.  If imaging positive, may likely benefit from consultation with neurology for further recommendation.  This may be altered or completely  changed at the discretion of the oncoming team pending results of further workup.   I discussed this case with my attending physician Dr. Doren Custard, who agreed with the proposed treatment course and cosigned this note including patient's presenting symptoms, physical exam, and planned diagnostics and interventions.  Attending physician stated agreement with plan or made changes to plan which were implemented.     This chart was dictated using voice recognition software.  Despite best efforts to proofread, errors can occur which can change the documentation meaning.         Final Clinical Impression(s) / ED Diagnoses Final diagnoses:  Facial asymmetry  Numbness of both lower extremities  Numbness and tingling of both upper extremities  Left facial numbness    Rx / DC Orders ED Discharge Orders     None         Candace Cruise 21/97/58 1626    Godfrey Pick, MD 07/10/21 [phone]

## 2021-07-06 NOTE — Telephone Encounter (Signed)
---  Caller states that she has sciatica pain 7/10 and history of bulging disk. Her arms, leg, and face are getting numb that started yesterday that has decreased from yesterday  07/06/2021 9:43:00 AM Go to ED Now (or PCP triage) Luan Pulling, RN, [place]  Referrals GO TO FACILITY OTHER - SPECIFY  07/06/21 1101 - Pt noted to have gone to [facility] ED.

## 2021-07-06 NOTE — Discharge Instructions (Addendum)
Please follow-up with your primary care doctor. I have given you the information for due to different neurology groups. Please call them at the numbers listed above and see where you can be seen first. If you develop any new or concerning symptoms please seek additional medical care and evaluation. Please discuss all of your results with your primary care doctor.

## 2021-07-09 LAB — URINE CULTURE: Culture: 40000 — AB

## 2021-07-10 ENCOUNTER — Telehealth (HOSPITAL_BASED_OUTPATIENT_CLINIC_OR_DEPARTMENT_OTHER): Payer: Self-pay | Admitting: *Deleted

## 2021-07-10 NOTE — Telephone Encounter (Signed)
Post ED Visit - Positive Culture Follow-up  Culture report reviewed by antimicrobial stewardship pharmacist: [place] Team '[]'$  Elenor Quinones, Pharm.D. '[]'$  Heide Guile, Pharm.D., BCPS AQ-ID '[]'$  Parks Neptune, Pharm.D., BCPS '[]'$  Alycia Rossetti, Pharm.D., BCPS '[]'$  [place], Pharm.D., BCPS, AAHIVP '[]'$  Legrand Como, Pharm.D., BCPS, AAHIVP '[]'$  Salome Arnt, PharmD, BCPS '[]'$  Johnnette Gourd, PharmD, BCPS '[]'$  Hughes Better, PharmD, BCPS '[]'$  Leeroy Cha, PharmD '[]'$  Laqueta Linden, PharmD, BCPS '[]'$  Albertina Parr, PharmD  [place] Team '[]'$  Leodis Sias, PharmD '[]'$  Lindell Spar, PharmD '[]'$  Royetta Asal, PharmD '[]'$  Graylin Shiver, Rph '[]'$  Rema Fendt) Glennon Mac, PharmD '[]'$  Arlyn Dunning, PharmD '[]'$  Netta Cedars, PharmD '[x]'$  Dia Sitter, PharmD '[]'$  Leone Haven, PharmD '[]'$  Gretta Arab, PharmD '[]'$  Theodis Shove, PharmD '[]'$  Peggyann Juba, PharmD '[]'$  Reuel Boom, PharmD   Positive urine culture Pt not having any UTI symptoms at this time & no further patient follow-up is required at this time.  Patricia Burns 07/10/2021, 9:39 AM

## 2021-07-10 NOTE — Progress Notes (Signed)
ED Antimicrobial Stewardship Positive Culture Follow Up   Patricia Burns is an 66 y.o. female who presented to [facility] on 07/06/2021 with a chief complaint of facial and extremities numbness.  Chief Complaint  Patient presents with   Numbness    Recent Results (from the past 720 hour(s))  Urine Culture     Status: Abnormal   Collection Time: 07/06/21  3:35 PM   Specimen: Urine, Clean Catch  Result Value Ref Range Status   Specimen Description   Final    URINE, CLEAN CATCH Performed at [facility], [place] [address], [place], [place] [address]    Special Requests   Final    NONE Performed at [facility], [place] [address], [place], [place] [address]    Culture (A)  Final    40,000 COLONIES/mL ESCHERICHIA COLI 20,000 COLONIES/mL ENTEROCOCCUS FAECALIS    Report Status 07/09/2021 FINAL  Final   Organism ID, Bacteria ESCHERICHIA COLI (A)  Final   Organism ID, Bacteria ENTEROCOCCUS FAECALIS (A)  Final      Susceptibility   Escherichia coli - MIC*    AMPICILLIN 8 SENSITIVE Sensitive     CEFAZOLIN <=4 SENSITIVE Sensitive     CEFEPIME <=0.12 SENSITIVE Sensitive     CEFTRIAXONE <=0.25 SENSITIVE Sensitive     CIPROFLOXACIN <=0.25 SENSITIVE Sensitive     GENTAMICIN <=1 SENSITIVE Sensitive     IMIPENEM <=0.25 SENSITIVE Sensitive     NITROFURANTOIN <=16 SENSITIVE Sensitive     TRIMETH/SULFA <=20 SENSITIVE Sensitive     AMPICILLIN/SULBACTAM <=2 SENSITIVE Sensitive     PIP/TAZO <=4 SENSITIVE Sensitive     * 40,000 COLONIES/mL ESCHERICHIA COLI   Enterococcus faecalis - MIC*    AMPICILLIN <=2 SENSITIVE Sensitive     NITROFURANTOIN <=16 SENSITIVE Sensitive     VANCOMYCIN 1 SENSITIVE Sensitive     * 20,000 COLONIES/mL ENTEROCOCCUS FAECALIS   Plan: 1) symptom check for UTI 2) If patient is symptomatic, then start Macrobid 100 mg BID x5 days  ED Provider: Thamas Jaegers, MD   Dia Sitter P 07/10/2021, 9:10 AM Clinical Pharmacist [phone]

## 2021-07-19 ENCOUNTER — Ambulatory Visit (INDEPENDENT_AMBULATORY_CARE_PROVIDER_SITE_OTHER): Payer: Medicare PPO

## 2021-07-19 ENCOUNTER — Encounter: Payer: Self-pay | Admitting: Family

## 2021-07-19 ENCOUNTER — Ambulatory Visit (INDEPENDENT_AMBULATORY_CARE_PROVIDER_SITE_OTHER): Payer: Medicare PPO | Admitting: Family

## 2021-07-19 VITALS — BP 120/60 | HR 77 | Temp 98.0°F | Ht 66.5 in | Wt 194.6 lb

## 2021-07-19 DIAGNOSIS — R300 Dysuria: Secondary | ICD-10-CM

## 2021-07-19 DIAGNOSIS — M5440 Lumbago with sciatica, unspecified side: Secondary | ICD-10-CM

## 2021-07-19 DIAGNOSIS — M545 Low back pain, unspecified: Secondary | ICD-10-CM | POA: Diagnosis not present

## 2021-07-19 DIAGNOSIS — M5136 Other intervertebral disc degeneration, lumbar region: Secondary | ICD-10-CM | POA: Diagnosis not present

## 2021-07-19 MED ORDER — METHYLPREDNISOLONE 4 MG PO TBPK: ORAL_TABLET | ORAL | 0 refills | Status: DC

## 2021-07-23 NOTE — Progress Notes (Signed)
Established Patient Office Visit  Subjective   Patient ID: ZULEMA PULASKI, female    DOB: August 30, 1955  Age: 66 y.o. MRN: [id number]  Chief Complaint  Patient presents with   Follow-up    Patient states she still notices facial drooping, seen in the ER 1 week ago due to left arm and facial numbness    Back Pain    Patient complains of low back pain x2 weeks, no known injury    HPI  66 year old female presents for a hospital follow-up after her daughter reports that it appears her face is dropping. Patient was not especially concerned but went to the ED on 5/23 for evaluation and work-up. Patient expressed that she thought it may be due to aging and hadn't noticed significant drop. However because she also felt numbness she had it checked. Work-up at the ED did not show any acute event of concerns for CVA. Today, all of her symptoms have resolved.   Patient also has concerns of low back pain that radiates down her left side. She has a history of sciatica. Pain 6/10, it is better after taking medication but would like treatment. Does not exercise and does not report any acute injury  Review of Systems  Eyes: Negative.   Respiratory: Negative.    Cardiovascular: Negative.   Musculoskeletal:  Positive for back pain.  Neurological: Negative.   Psychiatric/Behavioral: Negative.    All other systems reviewed and are negative.     Objective:     BP 120/60 (BP Location: Left Arm, Patient Position: Sitting, Cuff Size: Normal)   Pulse 77   Temp 98 F (36.7 C) (Oral)   Ht 5' 6.5" (1.689 m)   Wt 194 lb 9.6 oz (88.3 kg)   LMP 09/02/2013   SpO2 97%   BMI 30.94 kg/m    Physical Exam Vitals and nursing note reviewed.  Constitutional:      Appearance: Normal appearance.  Eyes:     Extraocular Movements: Extraocular movements intact.     Pupils: Pupils are equal, round, and reactive to light.  Cardiovascular:     Rate and Rhythm: Normal rate and regular rhythm.     Pulses: Normal  pulses.     Heart sounds: Normal heart sounds.  Pulmonary:     Effort: Pulmonary effort is normal.     Breath sounds: Normal breath sounds.  Musculoskeletal:        General: Tenderness present.     Cervical back: Normal range of motion and neck supple.     Comments: Left lower back tenderness. Pain with flexion at the hips and rotation. No obvious deformity  Skin:    General: Skin is warm and dry.  Neurological:     General: No focal deficit present.     Mental Status: She is alert and oriented to person, place, and time.  Psychiatric:        Mood and Affect: Mood normal.        Behavior: Behavior normal.      No results found for any visits on 07/19/21.    The 10-year ASCVD risk score (Arnett DK, et al., 2019) is: 5.1%    Assessment & Plan:   Problem List Items Addressed This Visit   None Visit Diagnoses     Low back pain with sciatica, sciatica laterality unspecified, unspecified back pain laterality, unspecified chronicity    -  Primary   Relevant Medications   methylPREDNISolone (MEDROL DOSEPAK) 4 MG TBPK tablet  Other Relevant Orders   DG Lumbar Spine Complete (Completed)   Dysuria       Relevant Orders   POC Urinalysis Dipstick   DG Lumbar Spine Complete (Completed)      Call the office if symptoms worsen or persist. Recheck as scheduled and sooner as needed.    Kennyth Arnold, FNP

## 2021-08-09 ENCOUNTER — Encounter: Payer: Medicare Other | Admitting: Family Medicine

## 2021-08-11 DIAGNOSIS — M25551 Pain in right hip: Secondary | ICD-10-CM | POA: Diagnosis not present

## 2021-08-11 DIAGNOSIS — M545 Low back pain, unspecified: Secondary | ICD-10-CM | POA: Diagnosis not present

## 2021-08-11 DIAGNOSIS — M25562 Pain in left knee: Secondary | ICD-10-CM | POA: Diagnosis not present

## 2021-08-18 ENCOUNTER — Other Ambulatory Visit: Payer: Medicare PPO

## 2021-09-07 ENCOUNTER — Encounter: Payer: Self-pay | Admitting: Internal Medicine

## 2021-09-07 ENCOUNTER — Ambulatory Visit (INDEPENDENT_AMBULATORY_CARE_PROVIDER_SITE_OTHER): Payer: Medicare PPO | Admitting: Internal Medicine

## 2021-09-07 VITALS — BP 122/74 | HR 60 | Temp 98.1°F | Ht 66.5 in | Wt 190.6 lb

## 2021-09-07 DIAGNOSIS — R748 Abnormal levels of other serum enzymes: Secondary | ICD-10-CM | POA: Diagnosis not present

## 2021-09-07 DIAGNOSIS — R319 Hematuria, unspecified: Secondary | ICD-10-CM

## 2021-09-07 DIAGNOSIS — R946 Abnormal results of thyroid function studies: Secondary | ICD-10-CM | POA: Diagnosis not present

## 2021-09-07 DIAGNOSIS — M79605 Pain in left leg: Secondary | ICD-10-CM | POA: Diagnosis not present

## 2021-09-07 DIAGNOSIS — Q670 Congenital facial asymmetry: Secondary | ICD-10-CM

## 2021-09-07 DIAGNOSIS — R399 Unspecified symptoms and signs involving the genitourinary system: Secondary | ICD-10-CM

## 2021-09-07 DIAGNOSIS — R7989 Other specified abnormal findings of blood chemistry: Secondary | ICD-10-CM | POA: Diagnosis not present

## 2021-09-07 LAB — POCT URINALYSIS DIPSTICK
Bilirubin, UA: NEGATIVE
Blood, UA: NEGATIVE
Glucose, UA: NEGATIVE
Ketones, UA: NEGATIVE
Leukocytes, UA: NEGATIVE
Nitrite, UA: NEGATIVE
Protein, UA: NEGATIVE
Spec Grav, UA: 1.01 (ref 1.010–1.025)
Urobilinogen, UA: NEGATIVE E.U./dL — AB
pH, UA: 6 (ref 5.0–8.0)

## 2021-09-07 NOTE — Patient Instructions (Addendum)
The leg symptoms  may be from back  or nerve compression but  checking ultrasound to make sure  not a blood clot.  You will be contacted about that appt.   .   I advise you to see neurology follow up about the  stroke like sx. That  you had .   I will order  lab work at [facility] can get   tomorrow   8-5 .

## 2021-09-07 NOTE — Progress Notes (Signed)
Chief Complaint  Patient presents with   Leg Pain    Leg Cramps  Urinary issues sometimes blood in urine     HPI: Patricia Burns 66 y.o. come in for [facility] acute visit ( told needed to be seen 24 hors by nurse triage. )  Having leg cramps.   Last uti  sx    antibiotic a few years  ago.  History of R hip left knee replacements .  Saw Dr Percell Miller  and  concern about back. Condition.  Few weeks ago  had sx of crawling feeling  and down to feet  left . Side near knee.  Tingling weeks back  about a month  comes and  goes.   Cant feel unless on feet.   No recent.  injury.   No swelling per se .  No injury .   Has some urinary urgency for a day or 2   told had some blood in urine when in hosp May. No rx for uti.  Hosp May for facial asymmetry cva work up  didn't make neuro appt   phone person said when asked if anyting different to be done and said no so she didn't make the appt...... No new neuro sx otherwise .   Had mri and ct scan.   ROS: See pertinent positives and negatives per HPI. No cp sob  has hx of abn lfts felt from ? Fatty liver?  Some tylenol  recently   etoh 1 pr night , neg hep c screen in 2020  Past Medical History:  Diagnosis Date   Anxiety    Arthritis    GERD (gastroesophageal reflux disease)    History of chicken pox    Osteopenia     Family History  Problem Relation Age of Onset   Arthritis Mother    Diabetes Father    Arthritis Father    Prostate cancer Father    Colon cancer Father    Cirrhosis Father        alcohol related   Crohn's disease Sister        1/2 sister; died from surgical complications   Heart disease Paternal Grandmother    Other Paternal Grandmother        brain tumor   Heart disease Paternal Grandfather    Colon cancer Paternal Aunt    Colon cancer Brother     Social History   Socioeconomic History   Marital status: Divorced    Spouse name: Not on file   Number of children: Not on file   Years of education: Not on file   Highest  education level: Not on file  Occupational History   Not on file  Tobacco Use   Smoking status: Former   Smokeless tobacco: Never  Vaping Use   Vaping Use: Never used  Substance and Sexual Activity   Alcohol use: Yes    Alcohol/week: 1.0 - 2.0 standard drink of alcohol    Types: 1 - 2 Glasses of wine per week    Comment: 1-2 drinks daily    Drug use: No   Sexual activity: Not on file  Other Topics Concern   Not on file  Social History Narrative   Work or School: Acupuncturist Situation: lives with duaghter      Spiritual Beliefs: Christian      Lifestyle: no regular exercise; diet is ok  Social Determinants of Health   Financial Resource Strain: Low Risk  (06/01/2021)   Overall Financial Resource Strain (CARDIA)    Difficulty of Paying Living Expenses: Not hard at all  Food Insecurity: No Food Insecurity (06/01/2021)   Hunger Vital Sign    Worried About Running Out of Food in the Last Year: Never true    Ran Out of Food in the Last Year: Never true  Transportation Needs: No Transportation Needs (06/01/2021)   PRAPARE - Hydrologist (Medical): No    Lack of Transportation (Non-Medical): No  Physical Activity: Inactive (06/01/2021)   Exercise Vital Sign    Days of Exercise per Week: 0 days    Minutes of Exercise per Session: 0 min  Stress: No Stress Concern Present (06/01/2021)   [place]    Feeling of Stress : Only a little  Social Connections: Moderately Isolated (05/20/2020)   Social Connection and Isolation Panel [NHANES]    Frequency of Communication with Friends and Family: More than three times a week    Frequency of Social Gatherings with Friends and Family: More than three times a week    Attends Religious Services: 1 to 4 times per year    Active Member of [employer] or Organizations: No    Attends Archivist Meetings: Never    Marital  Status: Divorced    Outpatient Medications Prior to Visit  Medication Sig Dispense Refill   CALCIUM PO Take by mouth daily.     Cholecalciferol (VITAMIN D3) 1000 units CAPS Take 1 capsule by mouth daily.     fluticasone (FLONASE) 50 MCG/ACT nasal spray Place 1 spray into both nostrils daily. (Patient taking differently: Place 1 spray into both nostrils daily as needed.) 16 g 6   methylPREDNISolone (MEDROL DOSEPAK) 4 MG TBPK tablet As scheduled 21 tablet 0   omeprazole (PRILOSEC) 20 MG capsule Take 2 capsules (40 mg total) by mouth daily. (Patient taking differently: Take 20 mg by mouth daily.)     Facility-Administered Medications Prior to Visit  Medication Dose Route Frequency Provider Last Rate Last Admin   0.9 %  sodium chloride infusion  500 mL Intravenous Once Armbruster, Carlota Raspberry, MD         EXAM:  BP 122/74 (BP Location: Right Arm, Patient Position: Sitting, Cuff Size: Normal)   Pulse 60   Temp 98.1 F (36.7 C) (Oral)   Ht 5' 6.5" (1.689 m)   Wt 190 lb 9.6 oz (86.5 kg)   LMP 09/02/2013   SpO2 97%   BMI 30.30 kg/m   Body mass index is 30.3 kg/m.  GENERAL: vitals reviewed and listed above, alert, oriented, appears well hydrated and in no acute distress HEENT: atraumatic, conjunctiva  clear, no obvious abnormalities on inspection of external nose and ears NECK: no obvious masses on inspection palpation  LUNGS: clear to auscultation bilaterally, no wheezes, rales or rhonchi, good air movement CV: HRRR, no clubbing cyanosis or  peripheral edema nl cap refill  MS: moves all extremities well healed knee scar  left leg with area of lateral calf   area of concern  no lesion or chord  and no bruising  PSYCH: pleasant and cooperative, no obvious depression or anxiety Lab Results  Component Value Date   WBC 10.3 07/06/2021   HGB 16.7 (H) 07/06/2021   HCT 48.0 (H) 07/06/2021   PLT 222 07/06/2021   GLUCOSE 127 (H) 07/06/2021  CHOL 205 (H) 08/07/2020   TRIG 83.0 08/07/2020    HDL 52.90 08/07/2020   LDLCALC 136 (H) 08/07/2020   ALT 111 (H) 07/06/2021   AST 102 (H) 07/06/2021   NA 139 07/06/2021   K 3.9 07/06/2021   CL 106 07/06/2021   CREATININE 0.84 07/06/2021   BUN 12 07/06/2021   CO2 24 07/06/2021   TSH 3.00 03/24/2016   INR 1.0 07/06/2021   HGBA1C 5.7 06/01/2021   BP Readings from Last 3 Encounters:  09/07/21 122/74  07/19/21 120/60  07/06/21 112/77    ASSESSMENT AND PLAN:  Discussed the following assessment and plan:  Leg pain, lateral, left - Plan: VAS [place] LOWER EXTREMITY VENOUS (DVT), CBC with Differential/Platelet, Basic metabolic panel, TSH, Hepatic function panel, Magnesium, T4, free, Hep B Surface Antigen, Hep B Surface Antibody, Hepatitis B core antibody, total  Lower urinary tract symptoms (LUTS) - check u cx today contact if gets worse  for rx  - Plan: CBC with Differential/Platelet, Basic metabolic panel, TSH, Hepatic function panel, Magnesium, T4, free, Hep B Surface Antigen, Hep B Surface Antibody, Hepatitis B core antibody, total  Elevated liver enzymes - Plan: CBC with Differential/Platelet, Basic metabolic panel, TSH, Hepatic function panel, Magnesium, T4, free, Hep B Surface Antigen, Hep B Surface Antibody, Hepatitis B core antibody, total  Elevated liver function tests - Plan: CBC with Differential/Platelet, Basic metabolic panel, TSH, Hepatic function panel, Magnesium, T4, free, Hep B Surface Antigen, Hep B Surface Antibody, Hepatitis B core antibody, total  Borderline abnormal TFTs - Plan: CBC with Differential/Platelet, Basic metabolic panel, TSH, Hepatic function panel, Magnesium, T4, free, Hep B Surface Antigen, Hep B Surface Antibody, Hepatitis B core antibody, total  Hematuria, unspecified type - not on screen today - Plan: POC Urinalysis Dipstick, Urine Culture, CBC with Differential/Platelet, Basic metabolic panel, TSH, Hepatic function panel, Magnesium, T4, free, Hep B Surface Antigen, Hep B Surface Antibody, Hepatitis B  core antibody, total  Facial asymmetry - hosp for  cva eval told not but didnt have neuro FU  no prgression    Many concerns today  but   leg sx and  urinary   addressed first   informed that fu with new pcp and neuro may be in order for other eval if needed  Check ur  cx :seems clear today but had low count bacteria in may hospital and no antibiotic given. Pain may be neuro cause   r/o vascular  dvt less likely but sent in as emergent care  for today . Patient requests checking magnesium and other blood test.    Has upcoming appt with new PCP in August  . Can get lab at [facility] this week and  keep appt with new pcp.    Fu earlier depending .  -Patient advised to return or notify health care team  if  new concerns arise. Record review  eval counsel order and fu 45 minutes  Patient Instructions  The leg symptoms  may be from back  or nerve compression but  checking ultrasound to make sure  not a blood clot.  You will be contacted about that appt.   .   I advise you to see neurology follow up about the  stroke like sx. That  you had .   I will order  lab work at [facility] can get   tomorrow   8-5 .             Standley Brooking. Breanne Olvera M.D.

## 2021-09-08 ENCOUNTER — Other Ambulatory Visit (INDEPENDENT_AMBULATORY_CARE_PROVIDER_SITE_OTHER): Payer: Medicare PPO

## 2021-09-08 DIAGNOSIS — R946 Abnormal results of thyroid function studies: Secondary | ICD-10-CM

## 2021-09-08 DIAGNOSIS — R399 Unspecified symptoms and signs involving the genitourinary system: Secondary | ICD-10-CM | POA: Diagnosis not present

## 2021-09-08 DIAGNOSIS — R748 Abnormal levels of other serum enzymes: Secondary | ICD-10-CM

## 2021-09-08 DIAGNOSIS — R319 Hematuria, unspecified: Secondary | ICD-10-CM

## 2021-09-08 DIAGNOSIS — Z1231 Encounter for screening mammogram for malignant neoplasm of breast: Secondary | ICD-10-CM | POA: Diagnosis not present

## 2021-09-08 DIAGNOSIS — R7989 Other specified abnormal findings of blood chemistry: Secondary | ICD-10-CM

## 2021-09-08 DIAGNOSIS — M79605 Pain in left leg: Secondary | ICD-10-CM

## 2021-09-08 LAB — HEPATIC FUNCTION PANEL
ALT: 118 U/L — ABNORMAL HIGH (ref 0–35)
AST: 126 U/L — ABNORMAL HIGH (ref 0–37)
Albumin: 4.4 g/dL (ref 3.5–5.2)
Alkaline Phosphatase: 79 U/L (ref 39–117)
Bilirubin, Direct: 0.1 mg/dL (ref 0.0–0.3)
Total Bilirubin: 0.6 mg/dL (ref 0.2–1.2)
Total Protein: 8 g/dL (ref 6.0–8.3)

## 2021-09-08 LAB — CBC WITH DIFFERENTIAL/PLATELET
Basophils Absolute: 0.1 10*3/uL (ref 0.0–0.1)
Basophils Relative: 0.9 % (ref 0.0–3.0)
Eosinophils Absolute: 0.2 10*3/uL (ref 0.0–0.7)
Eosinophils Relative: 1.6 % (ref 0.0–5.0)
HCT: 45.9 % (ref 36.0–46.0)
Hemoglobin: 15.6 g/dL — ABNORMAL HIGH (ref 12.0–15.0)
Lymphocytes Relative: 25.8 % (ref 12.0–46.0)
Lymphs Abs: 2.9 10*3/uL (ref 0.7–4.0)
MCHC: 33.9 g/dL (ref 30.0–36.0)
MCV: 96.2 fl (ref 78.0–100.0)
Monocytes Absolute: 0.8 10*3/uL (ref 0.1–1.0)
Monocytes Relative: 7 % (ref 3.0–12.0)
Neutro Abs: 7.4 10*3/uL (ref 1.4–7.7)
Neutrophils Relative %: 64.7 % (ref 43.0–77.0)
Platelets: 232 10*3/uL (ref 150.0–400.0)
RBC: 4.77 Mil/uL (ref 3.87–5.11)
RDW: 13.7 % (ref 11.5–15.5)
WBC: 11.4 10*3/uL — ABNORMAL HIGH (ref 4.0–10.5)

## 2021-09-08 LAB — MAGNESIUM: Magnesium: 1.8 mg/dL (ref 1.5–2.5)

## 2021-09-08 LAB — URINE CULTURE
MICRO NUMBER:: 13691487
Result:: NO GROWTH
SPECIMEN QUALITY:: ADEQUATE

## 2021-09-08 LAB — BASIC METABOLIC PANEL
BUN: 18 mg/dL (ref 6–23)
CO2: 22 mEq/L (ref 19–32)
Calcium: 9.6 mg/dL (ref 8.4–10.5)
Chloride: 105 mEq/L (ref 96–112)
Creatinine, Ser: 1.31 mg/dL — ABNORMAL HIGH (ref 0.40–1.20)
GFR: 42.63 mL/min — ABNORMAL LOW (ref >60.00)
Glucose, Bld: 133 mg/dL — ABNORMAL HIGH (ref 70–99)
Potassium: 4 mEq/L (ref 3.5–5.1)
Sodium: 138 mEq/L (ref 135–145)

## 2021-09-08 LAB — T4, FREE: Free T4: 0.71 ng/dL (ref 0.60–1.60)

## 2021-09-08 LAB — TSH: TSH: 5.38 u[IU]/mL (ref 0.35–5.50)

## 2021-09-08 LAB — HM MAMMOGRAPHY

## 2021-09-09 ENCOUNTER — Telehealth: Payer: Self-pay | Admitting: *Deleted

## 2021-09-09 LAB — HEPATITIS B SURFACE ANTIGEN: Hepatitis B Surface Ag: NONREACTIVE

## 2021-09-09 LAB — HEPATITIS B SURFACE ANTIBODY,QUALITATIVE: Hep B S Ab: NONREACTIVE

## 2021-09-09 LAB — HEPATITIS B CORE ANTIBODY, TOTAL: Hep B Core Total Ab: NONREACTIVE

## 2021-09-09 NOTE — Progress Notes (Signed)
Magnesium is  in range , kidney function slightly down  . This may be from hydration  status, should  have  fu  to be sure not important  when you see Dr Legrand Como.. . No anemia , thyroid normal  Liver  panel still abnormal showing on going inflammation ,should be followed up. When you see Dr Legrand Como in August .

## 2021-09-09 NOTE — Telephone Encounter (Signed)
-----   Message from Pearlean Brownie, [place] sent at 09/09/2021 11:04 AM EDT ----- Regarding: FW: Patient question/concern Patient TOC 10/01/21  ----- Message ----- From: Kristopher Oppenheim Sent: 09/09/2021  10:20 AM EDT To: Pearlean Brownie, CMA Subject: FW: Patient question/concern                   Patient called this morning and declined the appointment, order has been CX. ----- Message ----- From: Kristopher Oppenheim Sent: 09/08/2021   2:02 PM EDT To: Pearlean Brownie, CMA Subject: Patient question/concern                       I spoke with this patient eariler today regarding a order received for a DVT Vascular ultrasound. During the conversation the patient expressed concerns of the necessity of the study, and cost, I told the patient to reach out to the referring office for those concerns. Some additional information can be found in the order notes, let me know if you need anything from me, or if you have questions you can contact me directly at [phone]

## 2021-09-09 NOTE — Progress Notes (Signed)
Urine culture is negative  . NO evidence of UTI

## 2021-09-30 DIAGNOSIS — M9953 Intervertebral disc stenosis of neural canal of lumbar region: Secondary | ICD-10-CM | POA: Diagnosis not present

## 2021-09-30 DIAGNOSIS — M5116 Intervertebral disc disorders with radiculopathy, lumbar region: Secondary | ICD-10-CM | POA: Diagnosis not present

## 2021-10-01 ENCOUNTER — Encounter: Payer: Self-pay | Admitting: Family Medicine

## 2021-10-01 ENCOUNTER — Ambulatory Visit (INDEPENDENT_AMBULATORY_CARE_PROVIDER_SITE_OTHER): Payer: Medicare PPO | Admitting: Family Medicine

## 2021-10-01 VITALS — BP 104/58 | HR 100 | Temp 98.0°F | Ht 66.5 in | Wt 193.9 lb

## 2021-10-01 DIAGNOSIS — R739 Hyperglycemia, unspecified: Secondary | ICD-10-CM

## 2021-10-01 DIAGNOSIS — K760 Fatty (change of) liver, not elsewhere classified: Secondary | ICD-10-CM | POA: Diagnosis not present

## 2021-10-01 LAB — POCT GLYCOSYLATED HEMOGLOBIN (HGB A1C): Hemoglobin A1C: 5.4 % (ref 4.0–5.6)

## 2021-10-01 NOTE — Patient Instructions (Signed)
Try to limit sugar and starches in your diet and increase exercise

## 2021-10-01 NOTE — Progress Notes (Unsigned)
Established Patient Office Visit  Subjective   Patient ID: Patricia Burns, female    DOB: 24-May-1955  Age: 66 y.o. MRN: [id number]  Chief Complaint  Patient presents with   Establish Care    Patient is here for transition of care.   Patient reports that she has been having leg cramps, especially at night. States that she went to Dr. Percell Miller who told her she had a bulging disc in her back. States that she thought it might be her magnesium level.states that she started taking a supplement OTC for this.   Reports that she has a history of fatty liver disease and has been monitoring this with labs she just recently had done. I reviewed these with the patient and I ordered and A1C. We discussed reducing sugar and starches in her diet to help with her liver disease.    Patient Active Problem List   Diagnosis Date Noted   Fatty infiltration of liver 10/01/2021   Facial asymmetry 07/06/2021   GERD (gastroesophageal reflux disease) 02/21/2017   Hx of abnormal cervical Pap smear - sees gyn 10/02/2013   H/O bee sting allergy 10/02/2013   Borderline abnormal TFTs 10/02/2013      Review of Systems  All other systems reviewed and are negative.     Objective:     BP (!) 104/58 (BP Location: Left Arm, Patient Position: Sitting, Cuff Size: Large)   Pulse 100   Temp 98 F (36.7 C) (Oral)   Ht 5' 6.5" (1.689 m)   Wt 193 lb 14.4 oz (88 kg)   LMP 09/02/2013   SpO2 98%   BMI 30.83 kg/m  BP Readings from Last 3 Encounters:  10/01/21 (!) 104/58  09/07/21 122/74  07/19/21 120/60      Physical Exam Vitals reviewed.  Constitutional:      Appearance: Normal appearance. She is well-groomed. She is obese.  Eyes:     Extraocular Movements: Extraocular movements intact.     Pupils: Pupils are equal, round, and reactive to light.  Cardiovascular:     Rate and Rhythm: Normal rate and regular rhythm.     Pulses: Normal pulses.     Heart sounds: S1 normal and S2 normal.  Pulmonary:      Effort: Pulmonary effort is normal.     Breath sounds: Normal breath sounds and air entry.  Abdominal:     General: Bowel sounds are normal.     Palpations: Abdomen is soft.  Musculoskeletal:        General: Normal range of motion.     Right lower leg: No edema.     Left lower leg: No edema.  Skin:    General: Skin is warm and dry.  Neurological:     Mental Status: She is alert and oriented to person, place, and time. Mental status is at baseline.     Gait: Gait is intact.  Psychiatric:        Mood and Affect: Mood and affect normal.        Speech: Speech normal.        Behavior: Behavior normal.        Judgment: Judgment normal.      Results for orders placed or performed in visit on 10/01/21  POC HgB A1c  Result Value Ref Range   Hemoglobin A1C 5.4 4.0 - 5.6 %   HbA1c POC (<> result, manual entry)     HbA1c, POC (prediabetic range)     HbA1c, POC (controlled  diabetic range)      Last metabolic panel Lab Results  Component Value Date   GLUCOSE 133 (H) 09/08/2021   NA 138 09/08/2021   K 4.0 09/08/2021   CL 105 09/08/2021   CO2 22 09/08/2021   BUN 18 09/08/2021   CREATININE 1.31 (H) 09/08/2021   GFRNONAA >60 07/06/2021   CALCIUM 9.6 09/08/2021   PROT 8.0 09/08/2021   ALBUMIN 4.4 09/08/2021   BILITOT 0.6 09/08/2021   ALKPHOS 79 09/08/2021   AST 126 (H) 09/08/2021   ALT 118 (H) 09/08/2021   ANIONGAP 9 07/06/2021   Last lipids Lab Results  Component Value Date   CHOL 205 (H) 08/07/2020   HDL 52.90 08/07/2020   LDLCALC 136 (H) 08/07/2020   TRIG 83.0 08/07/2020   CHOLHDL 4 08/07/2020      The 10-year ASCVD risk score (Arnett DK, et al., 2019) is: 4.3%    Assessment & Plan:   Problem List Items Addressed This Visit       Digestive   Fatty infiltration of liver    Reviewed her last set of labs, we discussed lowering sugar and starches in her diet to help mobilize the triglycerides that are being stored in her liver. We will continue to monitor these  labs regularly. While she did have an elevated A1C in the past (5.7) her A1C today is WNL. Will continue to monitor this every 6 months as well.      Other Visit Diagnoses     Hyperglycemia    -  Primary   Relevant Orders   POC HgB A1c (Completed)       Return in about 6 months (around 04/03/2022) for follow up.    Farrel Conners, MD

## 2021-10-04 NOTE — Assessment & Plan Note (Signed)
Reviewed her last set of labs, we discussed lowering sugar and starches in her diet to help mobilize the triglycerides that are being stored in her liver. We will continue to monitor these labs regularly. While she did have an elevated A1C in the past (5.7) her A1C today is WNL. Will continue to monitor this every 6 months as well.

## 2021-10-16 DIAGNOSIS — M25552 Pain in left hip: Secondary | ICD-10-CM | POA: Diagnosis not present

## 2021-10-16 DIAGNOSIS — M545 Low back pain, unspecified: Secondary | ICD-10-CM | POA: Diagnosis not present

## 2021-10-22 DIAGNOSIS — M25552 Pain in left hip: Secondary | ICD-10-CM | POA: Diagnosis not present

## 2021-10-30 DIAGNOSIS — M545 Low back pain, unspecified: Secondary | ICD-10-CM | POA: Diagnosis not present

## 2021-11-11 DIAGNOSIS — M5416 Radiculopathy, lumbar region: Secondary | ICD-10-CM | POA: Diagnosis not present

## 2021-11-11 DIAGNOSIS — Z6832 Body mass index (BMI) 32.0-32.9, adult: Secondary | ICD-10-CM | POA: Diagnosis not present

## 2021-12-08 DIAGNOSIS — G609 Hereditary and idiopathic neuropathy, unspecified: Secondary | ICD-10-CM | POA: Diagnosis not present

## 2021-12-23 DIAGNOSIS — M545 Low back pain, unspecified: Secondary | ICD-10-CM | POA: Diagnosis not present

## 2021-12-23 DIAGNOSIS — M25552 Pain in left hip: Secondary | ICD-10-CM | POA: Diagnosis not present

## 2021-12-23 DIAGNOSIS — M5116 Intervertebral disc disorders with radiculopathy, lumbar region: Secondary | ICD-10-CM | POA: Diagnosis not present

## 2021-12-30 DIAGNOSIS — Z6832 Body mass index (BMI) 32.0-32.9, adult: Secondary | ICD-10-CM | POA: Diagnosis not present

## 2021-12-30 DIAGNOSIS — M5416 Radiculopathy, lumbar region: Secondary | ICD-10-CM | POA: Diagnosis not present

## 2022-02-02 IMAGING — US US ABDOMEN LIMITED
1 series · 14 of 25 positions shown · non-contrast
Comparison: None.

CLINICAL DATA: Elevated liver function tests.

EXAM:
ULTRASOUND ABDOMEN LIMITED RIGHT UPPER QUADRANT

[Series 1: us abdomen limited · 0.19mm/px · 14 of 39 slices shown]
[im 1/39]
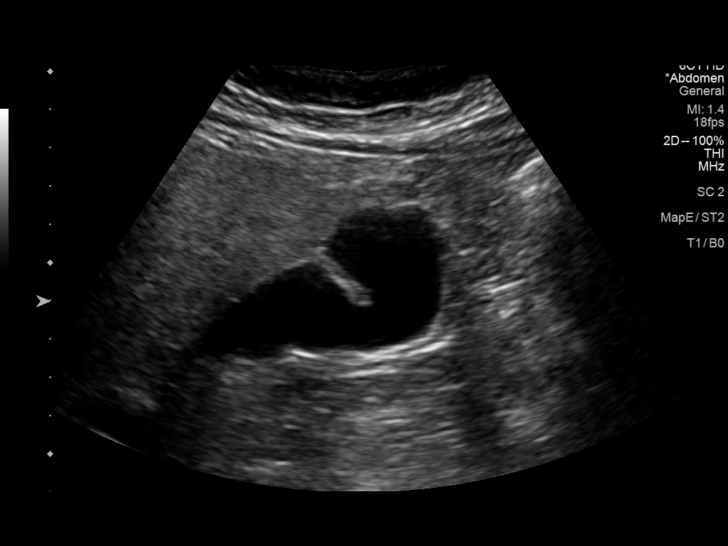
[im 4/39]
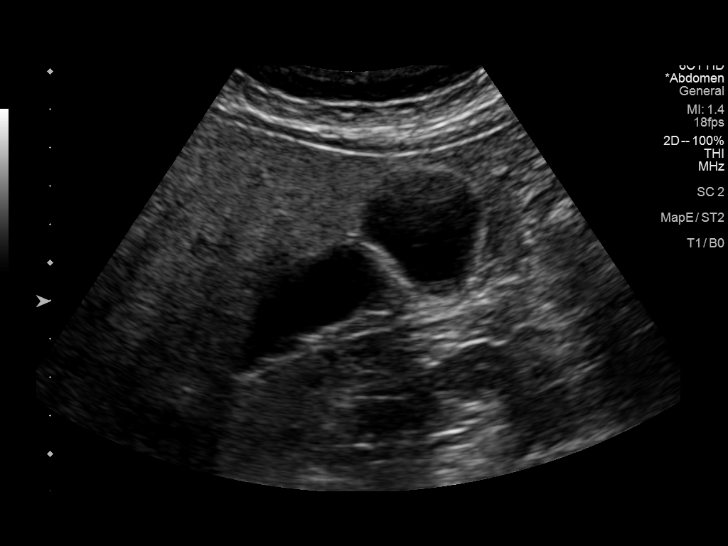
[im 7/39]
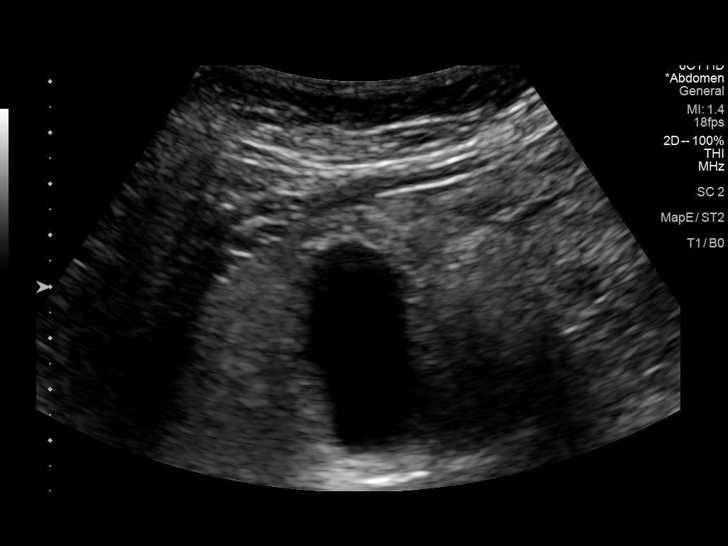
[im 10/39]
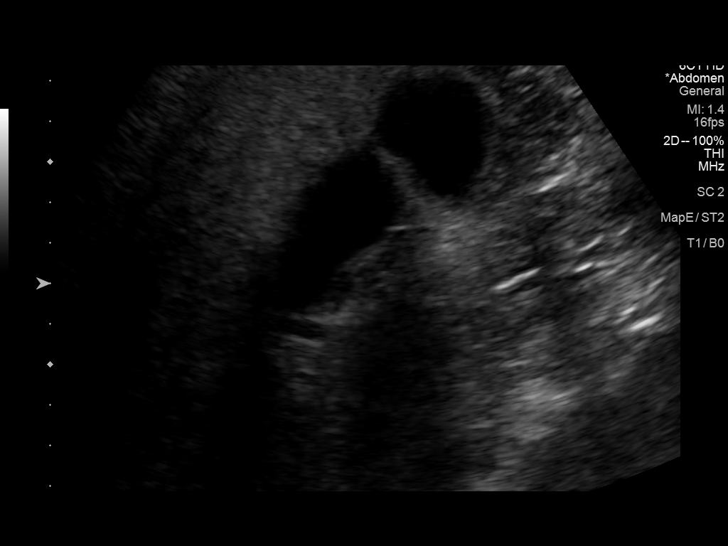
[im 13/39]
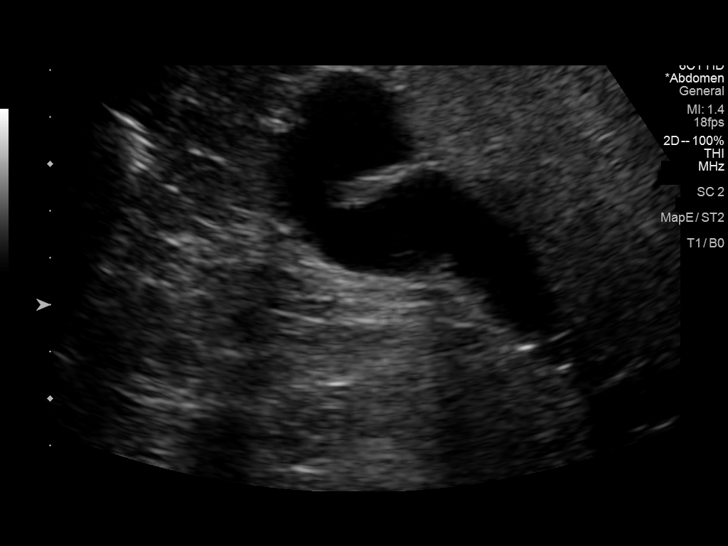
[im 15/39]
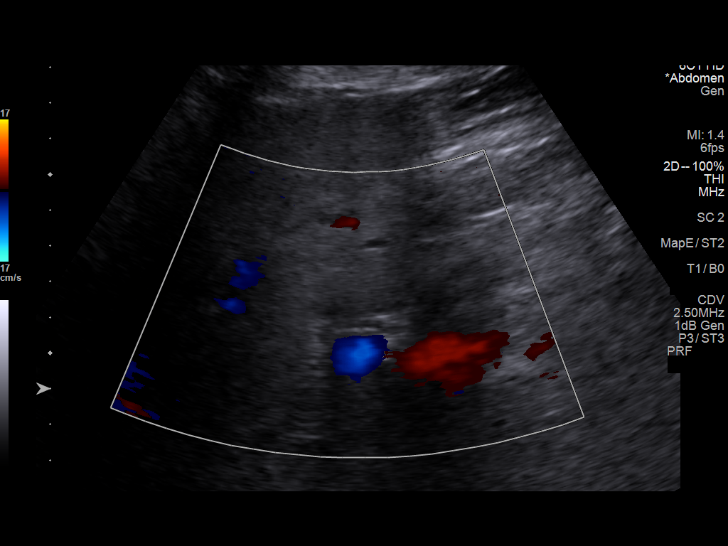
[im 18/39]
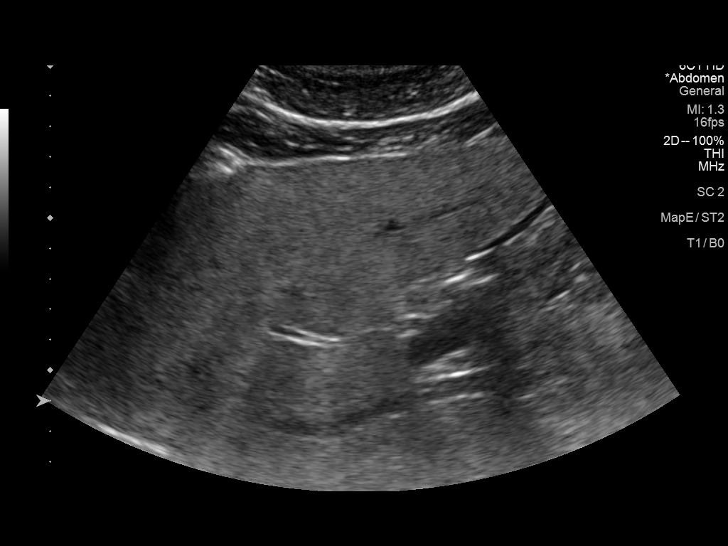
[im 21/39]
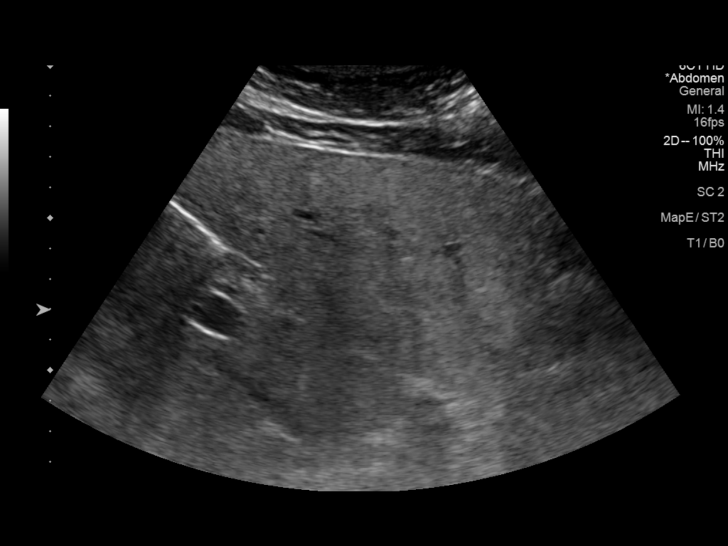
[im 24/39]
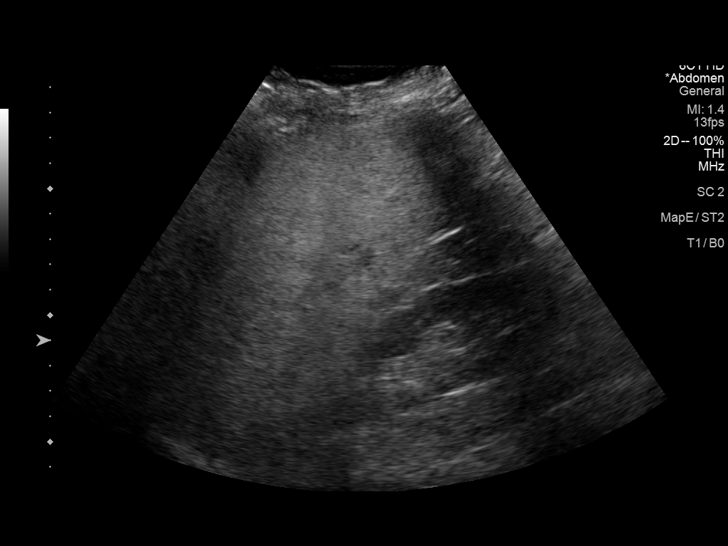
[im 26/39]
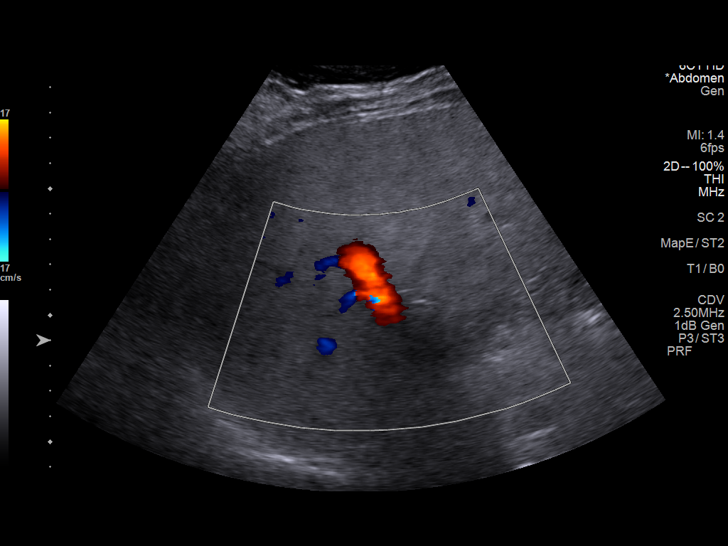
[im 29/39]
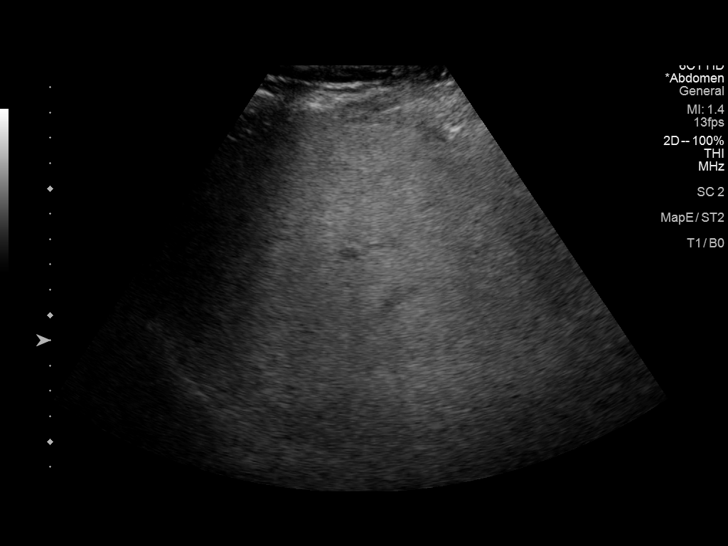
[im 32/39]
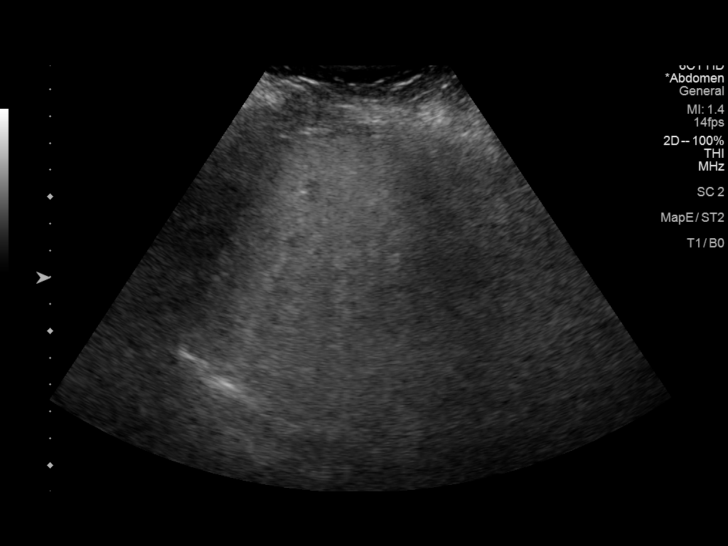
[im 35/39]
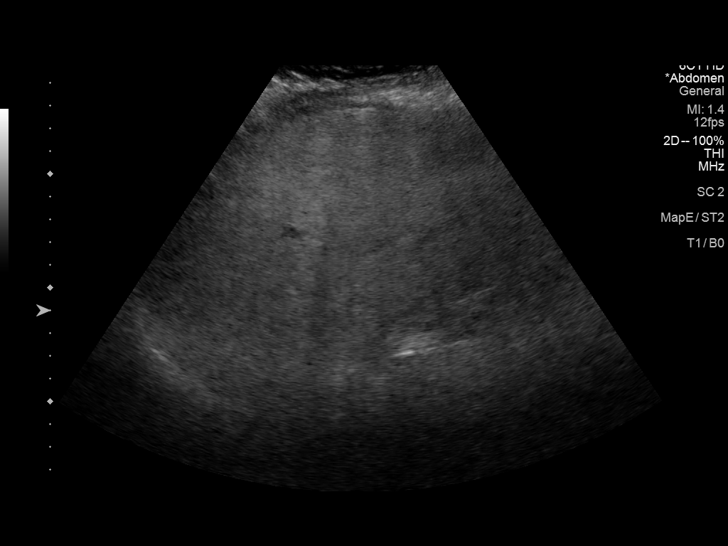
[im 39/39]
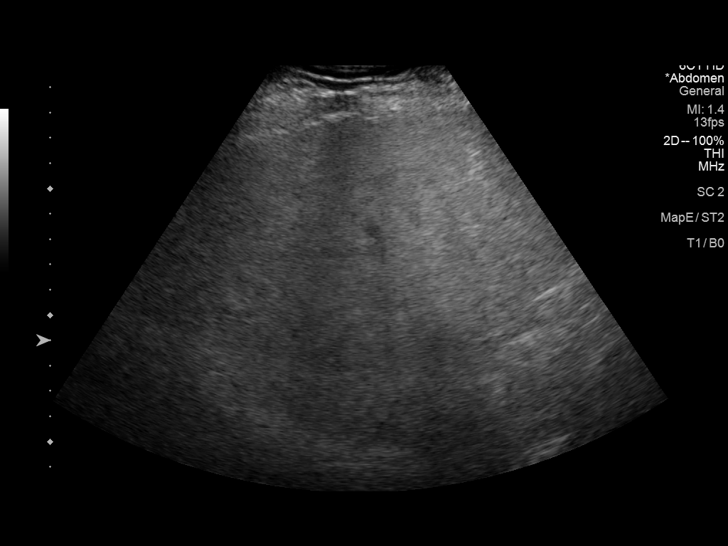

[14 of 25 positions shown; findings below may reference images not displayed]

FINDINGS: Gallbladder:

No gallstones or wall thickening visualized (1.7 mm). No sonographic
Murphy sign noted by sonographer.

Common bile duct:

Diameter: 3.6 mm

Liver:

No focal lesion identified. There is diffusely increased
echogenicity of the liver parenchyma. Portal vein is patent on color
Doppler imaging with normal direction of blood flow towards the
liver.

Other: None.
IMPRESSION: Fatty liver.

## 2022-03-29 ENCOUNTER — Encounter: Payer: Self-pay | Admitting: Gastroenterology

## 2022-05-18 DIAGNOSIS — M5416 Radiculopathy, lumbar region: Secondary | ICD-10-CM | POA: Diagnosis not present

## 2022-05-18 DIAGNOSIS — M47816 Spondylosis without myelopathy or radiculopathy, lumbar region: Secondary | ICD-10-CM | POA: Diagnosis not present

## 2022-05-18 DIAGNOSIS — M5126 Other intervertebral disc displacement, lumbar region: Secondary | ICD-10-CM | POA: Diagnosis not present

## 2022-05-23 ENCOUNTER — Telehealth: Payer: Self-pay | Admitting: Family Medicine

## 2022-05-23 NOTE — Telephone Encounter (Signed)
Contacted Sharrell Ku to schedule their annual wellness visit. Appointment made for 06/03/22.  Rudell Cobb AWV direct phone # [phone]

## 2022-05-26 DIAGNOSIS — M5416 Radiculopathy, lumbar region: Secondary | ICD-10-CM | POA: Diagnosis not present

## 2022-05-26 DIAGNOSIS — N83201 Unspecified ovarian cyst, right side: Secondary | ICD-10-CM | POA: Diagnosis not present

## 2022-05-26 DIAGNOSIS — Z6831 Body mass index (BMI) 31.0-31.9, adult: Secondary | ICD-10-CM | POA: Diagnosis not present

## 2022-06-03 ENCOUNTER — Ambulatory Visit (INDEPENDENT_AMBULATORY_CARE_PROVIDER_SITE_OTHER): Payer: Medicare PPO

## 2022-06-03 VITALS — Ht 66.5 in | Wt 193.0 lb

## 2022-06-03 DIAGNOSIS — Z0000 Encounter for general adult medical examination without abnormal findings: Secondary | ICD-10-CM

## 2022-06-03 NOTE — Progress Notes (Signed)
Subjective:   Patricia Burns is a 67 y.o. female who presents for Medicare Annual (Subsequent) preventive examination.  Review of Systems    Virtual Visit via Telephone Note  I connected with  [name] on 06/03/22 at  9:30 AM EDT by telephone and verified that I am speaking with the correct person using two identifiers.  Location: Patient: Home Provider: Office Persons participating in the virtual visit: patient/Nurse Health Advisor   I discussed the limitations, risks, security and privacy concerns of performing an evaluation and management service by telephone and the availability of in person appointments. The patient expressed understanding and agreed to proceed.  Interactive audio and video telecommunications were attempted between this nurse and patient, however failed, due to patient having technical difficulties OR patient did not have access to video capability.  We continued and completed visit with audio only.  Some vital signs may be absent or patient reported.   Tillie Rung, LPN  Cardiac Risk Factors include: advanced age (>50men, >68 women)     Objective:    Today's Vitals   06/03/22 0933  Weight: 193 lb (87.5 kg)  Height: 5' 6.5" (1.689 m)   Body mass index is 30.68 kg/m.     06/03/2022    9:40 AM 07/06/2021   11:14 AM 06/01/2021   10:53 AM 05/20/2020    1:31 PM 04/06/2017   10:06 AM  Advanced Directives  Does Patient Have a Medical Advance Directive? No No No No No  Would patient like information on creating a medical advance directive? No - Patient declined  No - Patient declined No - Patient declined No - Patient declined    Current Medications (verified) Outpatient Encounter Medications as of 06/03/2022  Medication Sig   CALCIUM PO Take by mouth daily.   Cholecalciferol (VITAMIN D3) 1000 units CAPS Take 1 capsule by mouth daily.   fluticasone (FLONASE) 50 MCG/ACT nasal spray Place 1 spray into both nostrils daily. (Patient taking differently:  Place 1 spray into both nostrils daily as needed.)   omeprazole (PRILOSEC) 20 MG capsule Take 2 capsules (40 mg total) by mouth daily. (Patient taking differently: Take 20 mg by mouth daily.)   Facility-Administered Encounter Medications as of 06/03/2022  Medication   0.9 %  sodium chloride infusion    Allergies (verified) Other and Amoxicillin   History: Past Medical History:  Diagnosis Date   Anxiety    Arthritis    GERD (gastroesophageal reflux disease)    History of chicken pox    Osteopenia    Past Surgical History:  Procedure Laterality Date   CESAREAN SECTION     x2-1983, 1984   REPLACEMENT TOTAL KNEE Left    TONSILLECTOMY     TOTAL HIP ARTHROPLASTY Right    Family History  Problem Relation Age of Onset   Arthritis Mother    Diabetes Father    Arthritis Father    Prostate cancer Father    Colon cancer Father    Cirrhosis Father        alcohol related   Crohn's disease Sister        1/2 sister; died from surgical complications   Heart disease Paternal Grandmother    Other Paternal Grandmother        brain tumor   Heart disease Paternal Grandfather    Colon cancer Paternal Aunt    Colon cancer Brother    Social History   Socioeconomic History   Marital status: Divorced  Spouse name: Not on file   Number of children: Not on file   Years of education: Not on file   Highest education level: Not on file  Occupational History   Not on file  Tobacco Use   Smoking status: Former   Smokeless tobacco: Never  Vaping Use   Vaping Use: Never used  Substance and Sexual Activity   Alcohol use: Yes    Alcohol/week: 1.0 - 2.0 standard drink of alcohol    Types: 1 - 2 Glasses of wine per week    Comment: 1-2 drinks daily    Drug use: No   Sexual activity: Not on file  Other Topics Concern   Not on file  Social History Narrative   Work or School: Civil Service fast streamer Situation: lives with duaghter      Spiritual Beliefs: Christian      Lifestyle: no  regular exercise; diet is ok            Social Determinants of Corporate investment banker Strain: Low Risk  (06/03/2022)   Overall Financial Resource Strain (CARDIA)    Difficulty of Paying Living Expenses: Not hard at all  Food Insecurity: No Food Insecurity (06/03/2022)   Hunger Vital Sign    Worried About Running Out of Food in the Last Year: Never true    Ran Out of Food in the Last Year: Never true  Transportation Needs: No Transportation Needs (06/03/2022)   PRAPARE - Administrator, Civil Service (Medical): No    Lack of Transportation (Non-Medical): No  Physical Activity: Inactive (06/03/2022)   Exercise Vital Sign    Days of Exercise per Week: 0 days    Minutes of Exercise per Session: 0 min  Stress: No Stress Concern Present (06/03/2022)   [employer] of Occupational Health - Occupational Stress Questionnaire    Feeling of Stress : Not at all  Social Connections: Moderately Integrated (06/03/2022)   Social Connection and Isolation Panel [NHANES]    Frequency of Communication with Friends and Family: More than three times a week    Frequency of Social Gatherings with Friends and Family: More than three times a week    Attends Religious Services: More than 4 times per year    Active Member of [employer] or Organizations: Yes    Attends Engineer, structural: More than 4 times per year    Marital Status: Divorced    Tobacco Counseling Counseling given: Not Answered   Clinical Intake:  Pre-visit preparation completed: No  Pain : No/denies pain     BMI - recorded: 30.68 Nutritional Status: BMI > 30  Obese Nutritional Risks: None Diabetes: No  How often do you need to have someone help you when you read instructions, pamphlets, or other written materials from your doctor or pharmacy?: 1 - Never  Diabetic?  No  Interpreter Needed?: No  Information entered by :: Theresa Mulligan LPN   Activities of Daily Living    06/03/2022    9:38 AM   In your present state of health, do you have any difficulty performing the following activities:  Hearing? 0  Vision? 0  Difficulty concentrating or making decisions? 0  Walking or climbing stairs? 0  Dressing or bathing? 0  Doing errands, shopping? 0  Preparing Food and eating ? N  Using the Toilet? N  In the past six months, have you accidently leaked urine? N  Do you have problems with loss  of bowel control? N  Managing your Medications? N  Managing your Finances? N  Housekeeping or managing your Housekeeping? N    Patient Care Team: Karie Georges, MD as PCP - General (Family Medicine) Tracey Harries, MD as PCP - OBGYN (Obstetrics and Gynecology) Wynn Banker, MD (Inactive) as Consulting Physician (Family Medicine)  Indicate any recent Medical Services you may have received from other than [facility] providers in the past year (date may be approximate).     Assessment:   This is a routine wellness examination for Patricia Burns.  Hearing/Vision screen Hearing Screening - Comments:: Denies hearing difficulties   Vision Screening - Comments:: Wears rx glasses - up to date with routine eye exams with  [facility]  Dietary issues and exercise activities discussed: Exercise limited by: None identified   Goals Addressed               This Visit's Progress     Stay Healthy (pt-stated)         Depression Screen    06/03/2022    9:37 AM 09/07/2021    4:19 PM 07/19/2021   12:13 PM 06/01/2021   10:54 AM 05/20/2020    1:34 PM 05/20/2020    1:28 PM 07/17/2019    1:31 PM  PHQ 2/9 Scores  PHQ - 2 Score 0 0 0 0 0 0 0  PHQ- 9 Score  1 1        Fall Risk    06/03/2022    9:39 AM 09/07/2021    4:18 PM 06/01/2021   10:54 AM 05/20/2020    1:32 PM  Fall Risk   Falls in the past year? 0 0 0 0  Number falls in past yr: 0  0 0  Injury with Fall? 0 0 0 0  Risk for fall due to : No Fall Risks No Fall Risks No Fall Risks   Follow up Falls prevention discussed Falls prevention  discussed Falls evaluation completed;Education provided;Falls prevention discussed Falls evaluation completed    FALL RISK PREVENTION PERTAINING TO THE HOME:  Any stairs in or around the home? No If so, are there any without handrails? No  Home free of loose throw rugs in walkways, pet beds, electrical cords, etc? Yes  Adequate lighting in your home to reduce risk of falls? Yes   ASSISTIVE DEVICES UTILIZED TO PREVENT FALLS:  Life alert? No  Use of a cane, walker or w/c? No  Grab bars in the bathroom? No  Shower chair or bench in shower? No  Elevated toilet seat or a handicapped toilet? No   TIMED UP AND GO:  Was the test performed? No . Audio Visit Cognitive Function:        06/03/2022    9:40 AM 06/01/2021   10:56 AM  6CIT Screen  What Year? 0 points 0 points  What month? 0 points 0 points  What time? 0 points 0 points  Count back from 20 0 points 0 points  Months in reverse 0 points 0 points  Repeat phrase 0 points 8 points  Total Score 0 points 8 points    Immunizations Immunization History  Administered Date(s) Administered   Influenza,inj,Quad PF,6+ Mos 03/24/2016, 12/29/2016, 01/16/2018   Tdap 01/16/2018    TDAP status: Up to date  Flu Vaccine status: Up to date  Pneumococcal vaccine status: Declined,  Education has been provided regarding the importance of this vaccine but patient still declined. Advised may receive this vaccine at local pharmacy  or Health Dept. Aware to provide a copy of the vaccination record if obtained from local pharmacy or Health Dept. Verbalized acceptance and understanding.   Covid-19 vaccine status: Declined, Education has been provided regarding the importance of this vaccine but patient still declined. Advised may receive this vaccine at local pharmacy or Health Dept.or vaccine clinic. Aware to provide a copy of the vaccination record if obtained from local pharmacy or Health Dept. Verbalized acceptance and  understanding.  Qualifies for Shingles Vaccine? Yes   Zostavax completed No   Shingrix Completed?: No.    Education has been provided regarding the importance of this vaccine. Patient has been advised to call insurance company to determine out of pocket expense if they have not yet received this vaccine. Advised may also receive vaccine at local pharmacy or Health Dept. Verbalized acceptance and understanding.  Screening Tests Health Maintenance  Topic Date Due   COVID-19 Vaccine (1) 06/19/2022 (Originally 04/06/1956)   Zoster Vaccines- Shingrix (1 of 2) 09/02/2022 (Originally 10/04/2005)   Pneumonia Vaccine 9+ Years old (1 of 1 - PCV) 06/03/2023 (Originally 10/04/2020)   MAMMOGRAM  06/03/2023 (Originally 07/19/2021)   COLONOSCOPY (Pts 45-36yrs Insurance coverage will need to be confirmed)  06/03/2023 (Originally 04/06/2022)   INFLUENZA VACCINE  09/15/2022   Medicare Annual Wellness (AWV)  06/03/2023   DTaP/Tdap/Td (2 - Td or Tdap) 01/17/2028   DEXA SCAN  Completed   Hepatitis C Screening  Completed   HPV VACCINES  Aged Out    Health Maintenance  There are no preventive care reminders to display for this patient.   Colorectal cancer screening: Referral to GI placed Deferred. Pt aware the office will call re: appt.  Mammogram status: Ordered Deferred. Pt provided with contact info and advised to call to schedule appt.   Bone Density status: Completed 01/26/17. Results reflect: Bone density results: OSTEOPOROSIS. Repeat every   years.  Lung Cancer Screening: (Low Dose CT Chest recommended if Age 43-80 years, 30 pack-year currently smoking OR have quit w/in 15years.) does not qualify.     Additional Screening:  Hepatitis C Screening: does qualify; Completed 03/12/18  Vision Screening: Recommended annual ophthalmology exams for early detection of glaucoma and other disorders of the eye. Is the patient up to date with their annual eye exam?  Yes  Who is the provider or what is the  name of the office in which the patient attends annual eye exams? [facility] If pt is not established with a provider, would they like to be referred to a provider to establish care? No .   Dental Screening: Recommended annual dental exams for proper oral hygiene  Community Resource Referral / Chronic Care Management:  CRR required this visit?  No   CCM required this visit?  No      Plan:     I have personally reviewed and noted the following in the patient's chart:   Medical and social history Use of alcohol, tobacco or illicit drugs  Current medications and supplements including opioid prescriptions. Patient is not currently taking opioid prescriptions. Functional ability and status Nutritional status Physical activity Advanced directives List of other physicians Hospitalizations, surgeries, and ER visits in previous 12 months Vitals Screenings to include cognitive, depression, and falls Referrals and appointments  In addition, I have reviewed and discussed with patient certain preventive protocols, quality metrics, and best practice recommendations. A written personalized care plan for preventive services as well as general preventive health recommendations were provided to patient.  Tillie Rung, LPN   1/61/0960   Nurse Notes:   None

## 2022-06-03 NOTE — Patient Instructions (Addendum)
Patricia Burns , Thank you for taking time to come for your Medicare Wellness Visit. I appreciate your ongoing commitment to your health goals. Please review the following plan we discussed and let me know if I can assist you in the future.   These are the goals we discussed:  Goals       Patient Stated      06/01/2021, wants to lose weight      Stay Healthy (pt-stated)      Weight (lb) < 200 lb (90.7 kg)      Lose 10 lbs         This is a list of the screening recommended for you and due dates:  Health Maintenance  Topic Date Due   COVID-19 Vaccine (1) 06/19/2022*   Zoster (Shingles) Vaccine (1 of 2) 09/02/2022*   Pneumonia Vaccine (1 of 1 - PCV) 06/03/2023*   Mammogram  06/03/2023*   Colon Cancer Screening  06/03/2023*   Flu Shot  09/15/2022   Medicare Annual Wellness Visit  06/03/2023   DTaP/Tdap/Td vaccine (2 - Td or Tdap) 01/17/2028   DEXA scan (bone density measurement)  Completed   Hepatitis C Screening: USPSTF Recommendation to screen - Ages 46-79 yo.  Completed   HPV Vaccine  Aged Out  *Topic was postponed. The date shown is not the original due date.    Advanced directives: Advance directive discussed with you today. Even though you declined this today, please call our office should you change your mind, and we can give you the proper paperwork for you to fill out.   Conditions/risks identified: None  Next appointment: Follow up in one year for your annual wellness visit    Preventive Care 65 Years and Older, Female Preventive care refers to lifestyle choices and visits with your health care provider that can promote health and wellness. What does preventive care include? A yearly physical exam. This is also called an annual well check. Dental exams once or twice a year. Routine eye exams. Ask your health care provider how often you should have your eyes checked. Personal lifestyle choices, including: Daily care of your teeth and gums. Regular physical  activity. Eating a healthy diet. Avoiding tobacco and drug use. Limiting alcohol use. Practicing safe sex. Taking low-dose aspirin every day. Taking vitamin and mineral supplements as recommended by your health care provider. What happens during an annual well check? The services and screenings done by your health care provider during your annual well check will depend on your age, overall health, lifestyle risk factors, and family history of disease. Counseling  Your health care provider may ask you questions about your: Alcohol use. Tobacco use. Drug use. Emotional well-being. Home and relationship well-being. Sexual activity. Eating habits. History of falls. Memory and ability to understand (cognition). Work and work Astronomer. Reproductive health. Screening  You may have the following tests or measurements: Height, weight, and BMI. Blood pressure. Lipid and cholesterol levels. These may be checked every 5 years, or more frequently if you are over 36 years old. Skin check. Lung cancer screening. You may have this screening every year starting at age 110 if you have a 30-pack-year history of smoking and currently smoke or have quit within the past 15 years. Fecal occult blood test (FOBT) of the stool. You may have this test every year starting at age 110. Flexible sigmoidoscopy or colonoscopy. You may have a sigmoidoscopy every 5 years or a colonoscopy every 10 years starting at age 30. Hepatitis C  blood test. Hepatitis B blood test. Sexually transmitted disease (STD) testing. Diabetes screening. This is done by checking your blood sugar (glucose) after you have not eaten for a while (fasting). You may have this done every 1-3 years. Bone density scan. This is done to screen for osteoporosis. You may have this done starting at age 53. Mammogram. This may be done every 1-2 years. Talk to your health care provider about how often you should have regular mammograms. Talk with your  health care provider about your test results, treatment options, and if necessary, the need for more tests. Vaccines  Your health care provider may recommend certain vaccines, such as: Influenza vaccine. This is recommended every year. Tetanus, diphtheria, and acellular pertussis (Tdap, Td) vaccine. You may need a Td booster every 10 years. Zoster vaccine. You may need this after age 73. Pneumococcal 13-valent conjugate (PCV13) vaccine. One dose is recommended after age 18. Pneumococcal polysaccharide (PPSV23) vaccine. One dose is recommended after age 72. Talk to your health care provider about which screenings and vaccines you need and how often you need them. This information is not intended to replace advice given to you by your health care provider. Make sure you discuss any questions you have with your health care provider. Document Released: 02/27/2015 Document Revised: 10/21/2015 Document Reviewed: 12/02/2014 Elsevier Interactive Patient Education  2017 [employer] Prevention in the Home Falls can cause injuries. They can happen to people of all ages. There are many things you can do to make your home safe and to help prevent falls. What can I do on the outside of my home? Regularly fix the edges of walkways and driveways and fix any cracks. Remove anything that might make you trip as you walk through a door, such as a raised step or threshold. Trim any bushes or trees on the path to your home. Use bright outdoor lighting. Clear any walking paths of anything that might make someone trip, such as rocks or tools. Regularly check to see if handrails are loose or broken. Make sure that both sides of any steps have handrails. Any raised decks and porches should have guardrails on the edges. Have any leaves, snow, or ice cleared regularly. Use sand or salt on walking paths during winter. Clean up any spills in your garage right away. This includes oil or grease spills. What can I  do in the bathroom? Use night lights. Install grab bars by the toilet and in the tub and shower. Do not use towel bars as grab bars. Use non-skid mats or decals in the tub or shower. If you need to sit down in the shower, use a plastic, non-slip stool. Keep the floor dry. Clean up any water that spills on the floor as soon as it happens. Remove soap buildup in the tub or shower regularly. Attach bath mats securely with double-sided non-slip rug tape. Do not have throw rugs and other things on the floor that can make you trip. What can I do in the bedroom? Use night lights. Make sure that you have a light by your bed that is easy to reach. Do not use any sheets or blankets that are too big for your bed. They should not hang down onto the floor. Have a firm chair that has side arms. You can use this for support while you get dressed. Do not have throw rugs and other things on the floor that can make you trip. What can I do in the kitchen?  Clean up any spills right away. Avoid walking on wet floors. Keep items that you use a lot in easy-to-reach places. If you need to reach something above you, use a strong step stool that has a grab bar. Keep electrical cords out of the way. Do not use floor polish or wax that makes floors slippery. If you must use wax, use non-skid floor wax. Do not have throw rugs and other things on the floor that can make you trip. What can I do with my stairs? Do not leave any items on the stairs. Make sure that there are handrails on both sides of the stairs and use them. Fix handrails that are broken or loose. Make sure that handrails are as long as the stairways. Check any carpeting to make sure that it is firmly attached to the stairs. Fix any carpet that is loose or worn. Avoid having throw rugs at the top or bottom of the stairs. If you do have throw rugs, attach them to the floor with carpet tape. Make sure that you have a light switch at the top of the stairs  and the bottom of the stairs. If you do not have them, ask someone to add them for you. What else can I do to help prevent falls? Wear shoes that: Do not have high heels. Have rubber bottoms. Are comfortable and fit you well. Are closed at the toe. Do not wear sandals. If you use a stepladder: Make sure that it is fully opened. Do not climb a closed stepladder. Make sure that both sides of the stepladder are locked into place. Ask someone to hold it for you, if possible. Clearly mark and make sure that you can see: Any grab bars or handrails. First and last steps. Where the edge of each step is. Use tools that help you move around (mobility aids) if they are needed. These include: Canes. Walkers. Scooters. Crutches. Turn on the lights when you go into a dark area. Replace any light bulbs as soon as they burn out. Set up your furniture so you have a clear path. Avoid moving your furniture around. If any of your floors are uneven, fix them. If there are any pets around you, be aware of where they are. Review your medicines with your doctor. Some medicines can make you feel dizzy. This can increase your chance of falling. Ask your doctor what other things that you can do to help prevent falls. This information is not intended to replace advice given to you by your health care provider. Make sure you discuss any questions you have with your health care provider. Document Released: 11/27/2008 Document Revised: 07/09/2015 Document Reviewed: 03/07/2014 Elsevier Interactive Patient Education  2017 [employer].

## 2022-06-07 DIAGNOSIS — N83209 Unspecified ovarian cyst, unspecified side: Secondary | ICD-10-CM | POA: Diagnosis not present

## 2022-06-07 DIAGNOSIS — L900 Lichen sclerosus et atrophicus: Secondary | ICD-10-CM | POA: Diagnosis not present

## 2022-06-07 DIAGNOSIS — L292 Pruritus vulvae: Secondary | ICD-10-CM | POA: Diagnosis not present

## 2022-06-07 DIAGNOSIS — R102 Pelvic and perineal pain: Secondary | ICD-10-CM | POA: Diagnosis not present

## 2022-06-21 DIAGNOSIS — M19072 Primary osteoarthritis, left ankle and foot: Secondary | ICD-10-CM | POA: Diagnosis not present

## 2022-06-21 DIAGNOSIS — M19071 Primary osteoarthritis, right ankle and foot: Secondary | ICD-10-CM | POA: Diagnosis not present

## 2022-07-27 ENCOUNTER — Telehealth: Payer: Self-pay | Admitting: *Deleted

## 2022-07-27 NOTE — Telephone Encounter (Signed)
Patient was noted on a list given to me by clinical supervisor for lack of recent mammogram results.  Spoke with the patient and she stated she had a mammogram last year with the OB/GYN office.  Request was sent via fax from [facility].

## 2022-08-17 DIAGNOSIS — N83291 Other ovarian cyst, right side: Secondary | ICD-10-CM | POA: Diagnosis not present

## 2022-08-30 DIAGNOSIS — N83291 Other ovarian cyst, right side: Secondary | ICD-10-CM | POA: Diagnosis not present

## 2022-09-14 ENCOUNTER — Encounter (INDEPENDENT_AMBULATORY_CARE_PROVIDER_SITE_OTHER): Payer: Self-pay

## 2022-09-29 ENCOUNTER — Ambulatory Visit (INDEPENDENT_AMBULATORY_CARE_PROVIDER_SITE_OTHER): Payer: Medicare PPO | Admitting: Family Medicine

## 2022-09-29 ENCOUNTER — Encounter: Payer: Self-pay | Admitting: Family Medicine

## 2022-09-29 VITALS — BP 90/60 | HR 75 | Temp 97.9°F | Ht 65.0 in | Wt 189.4 lb

## 2022-09-29 DIAGNOSIS — Z13220 Encounter for screening for lipoid disorders: Secondary | ICD-10-CM

## 2022-09-29 DIAGNOSIS — Z1211 Encounter for screening for malignant neoplasm of colon: Secondary | ICD-10-CM

## 2022-09-29 DIAGNOSIS — Z0000 Encounter for general adult medical examination without abnormal findings: Secondary | ICD-10-CM | POA: Diagnosis not present

## 2022-09-29 LAB — CBC
HCT: 45.3 % (ref 36.0–46.0)
Hemoglobin: 15.1 g/dL — ABNORMAL HIGH (ref 12.0–15.0)
MCHC: 33.2 g/dL (ref 30.0–36.0)
MCV: 95.7 fl (ref 78.0–100.0)
Platelets: 171 10*3/uL (ref 150.0–400.0)
RBC: 4.73 Mil/uL (ref 3.87–5.11)
RDW: 13.5 % (ref 11.5–15.5)
WBC: 7.5 10*3/uL (ref 4.0–10.5)

## 2022-09-29 LAB — COMPREHENSIVE METABOLIC PANEL
ALT: 49 U/L — ABNORMAL HIGH (ref 0–35)
AST: 61 U/L — ABNORMAL HIGH (ref 0–37)
Albumin: 4.3 g/dL (ref 3.5–5.2)
Alkaline Phosphatase: 74 U/L (ref 39–117)
BUN: 13 mg/dL (ref 6–23)
CO2: 26 mEq/L (ref 19–32)
Calcium: 9.8 mg/dL (ref 8.4–10.5)
Chloride: 101 mEq/L (ref 96–112)
Creatinine, Ser: 0.81 mg/dL (ref 0.40–1.20)
GFR: 75.34 mL/min (ref >60.00)
Glucose, Bld: 97 mg/dL (ref 70–99)
Potassium: 4.1 mEq/L (ref 3.5–5.1)
Sodium: 135 mEq/L (ref 135–145)
Total Bilirubin: 0.5 mg/dL (ref 0.2–1.2)
Total Protein: 7.9 g/dL (ref 6.0–8.3)

## 2022-09-29 LAB — LIPID PANEL
Cholesterol: 166 mg/dL (ref 0–200)
HDL: 49.1 mg/dL (ref >39.00)
LDL Cholesterol: 102 mg/dL — ABNORMAL HIGH (ref 0–99)
NonHDL: 116.97
Total CHOL/HDL Ratio: 3
Triglycerides: 75 mg/dL (ref 0.0–149.0)
VLDL: 15 mg/dL (ref 0.0–40.0)

## 2022-09-29 NOTE — Progress Notes (Signed)
Complete physical exam  Patient: Patricia Burns   DOB: 1956/02/11   67 y.o. Female  MRN: [id number]  Subjective:    Chief Complaint  Patient presents with   Annual Exam    Patricia Burns is a 67 y.o. female who presents today for a complete physical exam. She reports consuming a general diet. The patient does not participate in regular exercise at present. She generally feels well. She reports sleeping well. She does not have additional problems to discuss today.    Most recent fall risk assessment:    09/29/2022   10:51 AM  Fall Risk   Falls in the past year? 0  Number falls in past yr: 0  Injury with Fall? 0  Risk for fall due to : No Fall Risks  Follow up Falls evaluation completed     Most recent depression screenings:    09/29/2022   10:52 AM 06/03/2022    9:37 AM  PHQ 2/9 Scores  PHQ - 2 Score 0 0    Vision:Within last year and Dental: No current dental problems and Receives regular dental care  Patient Active Problem List   Diagnosis Date Noted   Fatty infiltration of liver 10/01/2021   Facial asymmetry 07/06/2021   GERD (gastroesophageal reflux disease) 02/21/2017   Hx of abnormal cervical Pap smear - sees gyn 10/02/2013   H/O bee sting allergy 10/02/2013   Borderline abnormal TFTs 10/02/2013      Patient Care Team: Karie Georges, MD as PCP - General (Family Medicine) Tracey Harries, MD as PCP - OBGYN (Obstetrics and Gynecology) Wynn Banker, MD (Inactive) as Consulting Physician (Family Medicine)   Outpatient Medications Prior to Visit  Medication Sig   CALCIUM PO Take by mouth daily.   Cholecalciferol (VITAMIN D3) 1000 units CAPS Take 1 capsule by mouth daily.   fluticasone (FLONASE) 50 MCG/ACT nasal spray Place 1 spray into both nostrils daily. (Patient taking differently: Place 1 spray into both nostrils daily as needed.)   omeprazole (PRILOSEC) 20 MG capsule Take 2 capsules (40 mg total) by mouth daily. (Patient taking differently: Take  20 mg by mouth daily.)   Facility-Administered Medications Prior to Visit  Medication Dose Route Frequency Provider   0.9 %  sodium chloride infusion  500 mL Intravenous Once Armbruster, Willaim Rayas, MD    Review of Systems  HENT:  Negative for hearing loss.   Eyes:  Negative for blurred vision.  Respiratory:  Negative for shortness of breath.   Cardiovascular:  Negative for chest pain.  Gastrointestinal: Negative.   Genitourinary: Negative.   Musculoskeletal:  Negative for back pain.  Neurological:  Negative for headaches.  Psychiatric/Behavioral:  Negative for depression.   All other systems reviewed and are negative.      Objective:     BP 90/60 (BP Location: Left Arm, Patient Position: Sitting, Cuff Size: Normal)   Pulse 75   Temp 97.9 F (36.6 C) (Oral)   Ht 5\' 5"  (1.651 m)   Wt 189 lb 6.4 oz (85.9 kg)   LMP 09/02/2013   SpO2 98%   BMI 31.52 kg/m    Physical Exam Vitals reviewed.  Constitutional:      Appearance: Normal appearance. She is well-groomed and normal weight.  HENT:     Right Ear: Tympanic membrane and ear canal normal.     Left Ear: Tympanic membrane and ear canal normal.     Mouth/Throat:     Mouth: Mucous membranes are moist.  Pharynx: No posterior oropharyngeal erythema.  Eyes:     Conjunctiva/sclera: Conjunctivae normal.  Neck:     Thyroid: No thyromegaly.  Cardiovascular:     Rate and Rhythm: Normal rate and regular rhythm.     Pulses: Normal pulses.     Heart sounds: S1 normal and S2 normal.  Pulmonary:     Effort: Pulmonary effort is normal.     Breath sounds: Normal breath sounds and air entry.  Abdominal:     General: Abdomen is flat. Bowel sounds are normal.     Palpations: Abdomen is soft.  Musculoskeletal:     Right lower leg: No edema.     Left lower leg: No edema.  Lymphadenopathy:     Cervical: No cervical adenopathy.  Neurological:     Mental Status: She is alert and oriented to person, place, and time. Mental status  is at baseline.     Gait: Gait is intact.  Psychiatric:        Mood and Affect: Mood and affect normal.        Speech: Speech normal.        Behavior: Behavior normal.        Judgment: Judgment normal.      No results found for any visits on 09/29/22.     Assessment & Plan:    Routine Health Maintenance and Physical Exam  Immunization History  Administered Date(s) Administered   Influenza,inj,Quad PF,6+ Mos 03/24/2016, 12/29/2016, 01/16/2018   Tdap 01/16/2018    Health Maintenance  Topic Date Due   Zoster Vaccines- Shingrix (1 of 2) Never done   INFLUENZA VACCINE  09/15/2022   COVID-19 Vaccine (1 - 2023-24 season) 10/15/2022 (Originally 10/15/2021)   Pneumonia Vaccine 27+ Years old (1 of 1 - PCV) 06/03/2023 (Originally 10/04/2020)   MAMMOGRAM  06/03/2023 (Originally 09/09/2022)   Colonoscopy  06/03/2023 (Originally 04/06/2022)   Medicare Annual Wellness (AWV)  06/03/2023   DTaP/Tdap/Td (2 - Td or Tdap) 01/17/2028   DEXA SCAN  Completed   Hepatitis C Screening  Completed   HPV VACCINES  Aged Out    Discussed health benefits of physical activity, and encouraged her to engage in regular exercise appropriate for her age and condition.  Routine general medical examination at a health care facility -     CBC -     Comprehensive metabolic panel  Lipid screening -     Lipid panel; Future  Colon cancer screening -     Ambulatory referral to Gastroenterology  Normal physical exam findings today, labs ordered for annual surveillance. Pt reports that her brother was diagnosed with colon cancer 2 years ago, this means she needs screenings every 5 years. I have placed a referral for her. Counseled patient on healthy eating and exercise, handouts are given to the patient.   Return in 1 year (on 09/29/2023) for annual physical exam.     Karie Georges, MD

## 2022-09-29 NOTE — Patient Instructions (Signed)

## 2022-10-07 DIAGNOSIS — Z01419 Encounter for gynecological examination (general) (routine) without abnormal findings: Secondary | ICD-10-CM | POA: Diagnosis not present

## 2022-10-07 DIAGNOSIS — N83291 Other ovarian cyst, right side: Secondary | ICD-10-CM | POA: Diagnosis not present

## 2022-10-07 DIAGNOSIS — Z1231 Encounter for screening mammogram for malignant neoplasm of breast: Secondary | ICD-10-CM | POA: Diagnosis not present

## 2022-10-07 DIAGNOSIS — N83209 Unspecified ovarian cyst, unspecified side: Secondary | ICD-10-CM | POA: Diagnosis not present

## 2022-10-18 DIAGNOSIS — H2513 Age-related nuclear cataract, bilateral: Secondary | ICD-10-CM | POA: Diagnosis not present

## 2022-10-24 ENCOUNTER — Telehealth: Payer: Self-pay | Admitting: Family Medicine

## 2022-10-24 NOTE — Telephone Encounter (Signed)
Pt returned call

## 2022-10-24 NOTE — Telephone Encounter (Signed)
Unable to leave a message due to voicemail being full. 

## 2022-10-24 NOTE — Telephone Encounter (Signed)
Pt is calling and does not know how to use her mychart and would like her results from 09-29-2022

## 2022-10-25 NOTE — Telephone Encounter (Signed)
Patient called back and was informed of the results

## 2023-01-19 DIAGNOSIS — H35342 Macular cyst, hole, or pseudohole, left eye: Secondary | ICD-10-CM | POA: Diagnosis not present

## 2023-01-25 DIAGNOSIS — Z01818 Encounter for other preprocedural examination: Secondary | ICD-10-CM | POA: Diagnosis not present

## 2023-01-25 DIAGNOSIS — H35342 Macular cyst, hole, or pseudohole, left eye: Secondary | ICD-10-CM | POA: Diagnosis not present

## 2023-02-13 DIAGNOSIS — H2512 Age-related nuclear cataract, left eye: Secondary | ICD-10-CM | POA: Diagnosis not present

## 2023-02-22 DIAGNOSIS — H2512 Age-related nuclear cataract, left eye: Secondary | ICD-10-CM | POA: Diagnosis not present

## 2023-02-22 DIAGNOSIS — F419 Anxiety disorder, unspecified: Secondary | ICD-10-CM | POA: Diagnosis not present

## 2023-02-28 DIAGNOSIS — F419 Anxiety disorder, unspecified: Secondary | ICD-10-CM | POA: Diagnosis not present

## 2023-02-28 DIAGNOSIS — H35342 Macular cyst, hole, or pseudohole, left eye: Secondary | ICD-10-CM | POA: Diagnosis not present

## 2023-03-07 DIAGNOSIS — H35342 Macular cyst, hole, or pseudohole, left eye: Secondary | ICD-10-CM | POA: Diagnosis not present

## 2023-03-20 ENCOUNTER — Encounter: Payer: Self-pay | Admitting: Internal Medicine

## 2023-03-20 ENCOUNTER — Telehealth (INDEPENDENT_AMBULATORY_CARE_PROVIDER_SITE_OTHER): Payer: Medicare HMO | Admitting: Internal Medicine

## 2023-03-20 DIAGNOSIS — J111 Influenza due to unidentified influenza virus with other respiratory manifestations: Secondary | ICD-10-CM | POA: Diagnosis not present

## 2023-03-20 NOTE — Progress Notes (Signed)
[facility] [address] [place] [address] Dept: [phone] Dept Fax: [phone]  Virtual Video Visit  I connected with Sharrell Ku on 03/20/23 at  3:40 PM EST by a video enabled telemedicine application and verified that I am speaking with the correct person using two identifiers.   Location patient: Home Location provider: Clinic Total time: 10 minutes Persons participating in the virtual visit: Patient; Mary Sella CMA; Salvatore Decent, FNP-C  I discussed the limitations of evaluation and management by telemedicine and the availability of in-person appointments. The patient expressed understanding and agreed to proceed.  Chief Complaint  Patient presents with   Influenza    Thinks she has flu -grandson has it Cold, sneezing started last week    SUBJECTIVE:  HPI:  Patricia Burns presents complaining of nausea, decreased appetite, diarrhea. Was recent exposure to Influenza by her grandson. Does report decrease in frequency of diarrhea today.  Onset: 1 week ago Associated symptoms: generalized weakness, fatigue, runny nose, cough, headache Denies: vomiting , blood in stool Treatments tried: Mucinex DM, Advil  The following portions of the patient's history were reviewed and updated as appropriate: medical history, surgical history, medications, allergies, social history, and family history.    Past Medical History:  Diagnosis Date   Anxiety    Arthritis    GERD (gastroesophageal reflux disease)    History of chicken pox    Osteopenia    Past Surgical History:  Procedure Laterality Date   CESAREAN SECTION     x2-1983, 1984   REPLACEMENT TOTAL KNEE Left    TONSILLECTOMY     TOTAL HIP ARTHROPLASTY Right      Current Outpatient Medications:    omeprazole (PRILOSEC) 20 MG capsule, Take 2 capsules (40 mg total) by mouth daily. (Patient taking differently: Take 20 mg by mouth daily.), Disp: , Rfl:     CALCIUM PO, Take by mouth daily. (Patient not taking: Reported on 03/20/2023), Disp: , Rfl:    Cholecalciferol (VITAMIN D3) 1000 units CAPS, Take 1 capsule by mouth daily. (Patient not taking: Reported on 03/20/2023), Disp: , Rfl:    fluticasone (FLONASE) 50 MCG/ACT nasal spray, Place 1 spray into both nostrils daily. (Patient not taking: Reported on 03/20/2023), Disp: 16 g, Rfl: 6  Current Facility-Administered Medications:    0.9 %  sodium chloride infusion, 500 mL, Intravenous, Once, Armbruster, Willaim Rayas, MD Allergies  Allergen Reactions   Other     Bee stings   Amoxicillin Rash and Hives    Social History   Socioeconomic History   Marital status: Divorced    Spouse name: Not on file   Number of children: Not on file   Years of education: Not on file   Highest education level: Not on file  Occupational History   Not on file  Tobacco Use   Smoking status: Former   Smokeless tobacco: Never  Vaping Use   Vaping status: Never Used  Substance and Sexual Activity   Alcohol use: Yes    Alcohol/week: 1.0 - 2.0 standard drink of alcohol    Types: 1 - 2 Glasses of wine per week    Comment: 1-2 drinks daily    Drug use: No   Sexual activity: Not on file  Other Topics Concern   Not on file  Social History Narrative   Work or School: Civil Service fast streamer Situation: lives with duaghter      Spiritual Beliefs: Ephriam Knuckles  Lifestyle: no regular exercise; diet is ok            Social Drivers of Corporate investment banker Strain: Low Risk  (06/03/2022)   Overall Financial Resource Strain (CARDIA)    Difficulty of Paying Living Expenses: Not hard at all  Food Insecurity: No Food Insecurity (06/03/2022)   Hunger Vital Sign    Worried About Running Out of Food in the Last Year: Never true    Ran Out of Food in the Last Year: Never true  Transportation Needs: No Transportation Needs (06/03/2022)   PRAPARE - Administrator, Civil Service (Medical): No    Lack of  Transportation (Non-Medical): No  Physical Activity: Inactive (06/03/2022)   Exercise Vital Sign    Days of Exercise per Week: 0 days    Minutes of Exercise per Session: 0 min  Stress: No Stress Concern Present (06/03/2022)   [employer] of Occupational Health - Occupational Stress Questionnaire    Feeling of Stress : Not at all  Social Connections: Moderately Integrated (06/03/2022)   Social Connection and Isolation Panel [NHANES]    Frequency of Communication with Friends and Family: More than three times a week    Frequency of Social Gatherings with Friends and Family: More than three times a week    Attends Religious Services: More than 4 times per year    Active Member of [employer] or Organizations: Yes    Attends Engineer, structural: More than 4 times per year    Marital Status: Divorced  Intimate Partner Violence: Not At Risk (06/03/2022)   Humiliation, Afraid, Rape, and Kick questionnaire    Fear of Current or Ex-Partner: No    Emotionally Abused: No    Physically Abused: No    Sexually Abused: No    Family History  Problem Relation Age of Onset   Arthritis Mother    Diabetes Father    Arthritis Father    Prostate cancer Father    Colon cancer Father    Cirrhosis Father        alcohol related   Crohn's disease Sister        1/2 sister; died from surgical complications   Heart disease Paternal Grandmother    Other Paternal Grandmother        brain tumor   Heart disease Paternal Grandfather    Colon cancer Paternal Aunt    Colon cancer Brother      ROS: A complete ROS was performed with pertinent positives/negatives noted in the HPI. The remainder of the ROS are negative.    OBJECTIVE:  VITALS per patient if applicable: There were no vitals filed for this visit. There is no height or weight on file to calculate BMI.   GENERAL: Alert and oriented. Ill-appearing, nontoxic. No acute distress.  HEENT: Atraumatic. Conjunctiva clear. No obvious  abnormalities on inspection of external nose and ears. NECK: Normal movements of the head and neck. LUNGS: On inspection, no signs of respiratory distress. Breathing rate appears normal. No obvious gross SOB, gasping or wheezing, and no conversational dyspnea. CV: No obvious cyanosis. MS: Moves all visible extremities without noticeable abnormality. PSYCH/NEURO: Pleasant and cooperative. No obvious depression or anxiety. Speech and thought processing grossly intact.  ASSESSMENT AND PLAN: 1. Influenza (Primary) - rest - drink plenty of fluids (pedialyte)  - continue Mucinex DM for cough/congestion  - Advil/tylenol for headache  - bland diet for diarrhea  - can use imodium PRN for diarrhea  -  patient is out of time frame for tamiflu, and symptoms are improving.     I discussed the assessment and treatment plan with the patient. The patient was provided an opportunity to ask questions and all were answered. The patient agreed with the plan and demonstrated an understanding of the instructions.   The patient was advised to call back or seek an in-person evaluation if the symptoms worsen or if the condition fails to improve as anticipated.  Return if symptoms worsen or fail to improve.  Salvatore Decent, FNP

## 2023-03-24 ENCOUNTER — Ambulatory Visit: Payer: Self-pay | Admitting: Family Medicine

## 2023-03-24 NOTE — Telephone Encounter (Signed)
 2nd attempt to reach patient. Voicemail full. Unable to leave a message.

## 2023-03-24 NOTE — Telephone Encounter (Signed)
 This RN attempted third and final attempt to contact patient. Will route to provider as HP for further follow up.

## 2023-03-24 NOTE — Telephone Encounter (Signed)
 1st attempt - no answer. Unable to LVM. Will continue to attempt.      Copied From CRM [id number]. Reason for Triage: Patient coughing up green/brownish red mucus, had diarrhea for the past two weeks and has no appetite, would like an antibiotic.

## 2023-03-27 NOTE — Telephone Encounter (Signed)
 Ok to close

## 2023-04-11 DIAGNOSIS — N83291 Other ovarian cyst, right side: Secondary | ICD-10-CM | POA: Diagnosis not present

## 2023-04-14 DIAGNOSIS — H35342 Macular cyst, hole, or pseudohole, left eye: Secondary | ICD-10-CM | POA: Diagnosis not present

## 2023-06-09 ENCOUNTER — Ambulatory Visit: Payer: Medicare PPO

## 2023-06-09 ENCOUNTER — Telehealth: Payer: Self-pay

## 2023-06-09 NOTE — Telephone Encounter (Signed)
 Unsuccessful attempts to reach patient on preferred number listed in notes for scheduled AWV. Unable to leave message on voicemail.

## 2023-06-09 NOTE — Progress Notes (Deleted)
 Subjective:   Patricia Burns is a 68 y.o. who presents for a Medicare Wellness preventive visit.  Visit Complete: {VISITMETHODVS:[phone]}  {AWVVIDEO:32072}  Persons Participating in Visit: {Persons Participating in Visit:32444}  AWV Questionnaire: {AWVQuestionnaire:32338}        Objective:    There were no vitals filed for this visit. There is no height or weight on file to calculate BMI.     06/03/2022    9:40 AM 07/06/2021   11:14 AM 06/01/2021   10:53 AM 05/20/2020    1:31 PM 04/06/2017   10:06 AM  Advanced Directives  Does Patient Have a Medical Advance Directive? No No No No No  Would patient like information on creating a medical advance directive? No - Patient declined  No - Patient declined No - Patient declined No - Patient declined    Current Medications (verified) Outpatient Encounter Medications as of 06/09/2023  Medication Sig   CALCIUM PO Take by mouth daily. (Patient not taking: Reported on 03/20/2023)   Cholecalciferol (VITAMIN D3) 1000 units CAPS Take 1 capsule by mouth daily. (Patient not taking: Reported on 03/20/2023)   fluticasone  (FLONASE ) 50 MCG/ACT nasal spray Place 1 spray into both nostrils daily. (Patient not taking: Reported on 03/20/2023)   omeprazole  (PRILOSEC) 20 MG capsule Take 2 capsules (40 mg total) by mouth daily. (Patient taking differently: Take 20 mg by mouth daily.)   Facility-Administered Encounter Medications as of 06/09/2023  Medication   0.9 %  sodium chloride  infusion    Allergies (verified) Other and Amoxicillin   History: Past Medical History:  Diagnosis Date   Anxiety    Arthritis    GERD (gastroesophageal reflux disease)    History of chicken pox    Osteopenia    Past Surgical History:  Procedure Laterality Date   CESAREAN SECTION     x2-1983, 1984   REPLACEMENT TOTAL KNEE Left    TONSILLECTOMY     TOTAL HIP ARTHROPLASTY Right    Family History  Problem Relation Age of Onset   Arthritis Mother    Diabetes  Father    Arthritis Father    Prostate cancer Father    Colon cancer Father    Cirrhosis Father        alcohol related   Crohn's disease Sister        1/2 sister; died from surgical complications   Heart disease Paternal Grandmother    Other Paternal Grandmother        brain tumor   Heart disease Paternal Grandfather    Colon cancer Paternal Aunt    Colon cancer Brother    Social History   Socioeconomic History   Marital status: Divorced    Spouse name: Not on file   Number of children: Not on file   Years of education: Not on file   Highest education level: Not on file  Occupational History   Not on file  Tobacco Use   Smoking status: Former   Smokeless tobacco: Never  Vaping Use   Vaping status: Never Used  Substance and Sexual Activity   Alcohol use: Yes    Alcohol/week: 1.0 - 2.0 standard drink of alcohol    Types: 1 - 2 Glasses of wine per week    Comment: 1-2 drinks daily    Drug use: No   Sexual activity: Not on file  Other Topics Concern   Not on file  Social History Narrative   Work or School: Financial trader  Home Situation: lives with duaghter      Spiritual Beliefs: Christian      Lifestyle: no regular exercise; diet is ok            Social Drivers of Corporate investment banker Strain: Low Risk  (06/03/2022)   Overall Financial Resource Strain (CARDIA)    Difficulty of Paying Living Expenses: Not hard at all  Food Insecurity: No Food Insecurity (06/03/2022)   Hunger Vital Sign    Worried About Running Out of Food in the Last Year: Never true    Ran Out of Food in the Last Year: Never true  Transportation Needs: No Transportation Needs (06/03/2022)   PRAPARE - Administrator, Civil Service (Medical): No    Lack of Transportation (Non-Medical): No  Physical Activity: Inactive (06/03/2022)   Exercise Vital Sign    Days of Exercise per Week: 0 days    Minutes of Exercise per Session: 0 min  Stress: No Stress Concern Present  (06/03/2022)   [employer] of Occupational Health - Occupational Stress Questionnaire    Feeling of Stress : Not at all  Social Connections: Moderately Integrated (06/03/2022)   Social Connection and Isolation Panel [NHANES]    Frequency of Communication with Friends and Family: More than three times a week    Frequency of Social Gatherings with Friends and Family: More than three times a week    Attends Religious Services: More than 4 times per year    Active Member of Clubs or Organizations: Yes    Attends Banker Meetings: More than 4 times per year    Marital Status: Divorced    Tobacco Counseling Counseling given: Not Answered    Clinical Intake:              Lab Results  Component Value Date   HGBA1C 5.4 10/01/2021   HGBA1C 5.7 06/01/2021   HGBA1C 5.5 03/12/2018               Activities of Daily Living ***     No data to display          Patient Care Team: Aida House, MD as PCP - General (Family Medicine) Dolph Friar, MD as PCP - OBGYN (Obstetrics and Gynecology) Koberlein, Junell C, MD (Inactive) as Consulting Physician (Family Medicine)  Indicate any recent Medical Services you may have received from other than [facility] providers in the past year (date may be approximate).     Assessment:   This is a routine wellness examination for Patricia Burns.  Hearing/Vision screen No results found.   Goals Addressed   None    Depression Screen ***    09/29/2022   10:52 AM 06/03/2022    9:37 AM 09/07/2021    4:19 PM 07/19/2021   12:13 PM 06/01/2021   10:54 AM 05/20/2020    1:34 PM 05/20/2020    1:28 PM  PHQ 2/9 Scores  PHQ - 2 Score 0 0 0 0 0 0 0  PHQ- 9 Score   1 1       Fall Risk ***    09/29/2022   10:51 AM 06/03/2022    9:39 AM 09/07/2021    4:18 PM 06/01/2021   10:54 AM 05/20/2020    1:32 PM  Fall Risk   Falls in the past year? 0 0 0 0 0  Number falls in past yr: 0 0  0 0  Injury with Fall? 0 0 0 0 0  Risk for fall due to :  No Fall Risks No Fall Risks No Fall Risks No Fall Risks   Follow up Falls evaluation completed Falls prevention discussed Falls prevention discussed Falls evaluation completed;Education provided;Falls prevention discussed Falls evaluation completed    MEDICARE RISK AT HOME: ***    TIMED UP AND GO:  Was the test performed?  {AMBTIMEDUPGO:[id number]}  Cognitive Function: {CognitiveScreening:32337}        06/03/2022    9:40 AM 06/01/2021   10:56 AM  6CIT Screen  What Year? 0 points 0 points  What month? 0 points 0 points  What time? 0 points 0 points  Count back from 20 0 points 0 points  Months in reverse 0 points 0 points  Repeat phrase 0 points 8 points  Total Score 0 points 8 points    Immunizations Immunization History  Administered Date(s) Administered   Influenza,inj,Quad PF,6+ Mos 03/24/2016, 12/29/2016, 01/16/2018   Tdap 01/16/2018    Screening Tests Health Maintenance  Topic Date Due   Pneumonia Vaccine 31+ Years old (1 of 1 - PCV) Never done   Zoster Vaccines- Shingrix (1 of 2) Never done   Colonoscopy  04/06/2022   MAMMOGRAM  09/09/2022   COVID-19 Vaccine (1 - 2024-25 season) Never done   Medicare Annual Wellness (AWV)  06/03/2023   INFLUENZA VACCINE  09/15/2023   DTaP/Tdap/Td (2 - Td or Tdap) 01/17/2028   DEXA SCAN  Completed   Hepatitis C Screening  Completed   HPV VACCINES  Aged Out   Meningococcal B Vaccine  Aged Out    Health Maintenance  Health Maintenance Due  Topic Date Due   Pneumonia Vaccine 42+ Years old (1 of 1 - PCV) Never done   Zoster Vaccines- Shingrix (1 of 2) Never done   Colonoscopy  04/06/2022   MAMMOGRAM  09/09/2022   COVID-19 Vaccine (1 - 2024-25 season) Never done   Medicare Annual Wellness (AWV)  06/03/2023   Health Maintenance Items Addressed: {HMMCR (Optional):30011}  Additional Screening:  Vision Screening: Recommended annual ophthalmology exams for early detection of glaucoma and other disorders of the  eye.  Dental Screening: Recommended annual dental exams for proper oral hygiene  Community Resource Referral / Chronic Care Management: CRR required this visit?  {YES/NO:21197}  CCM required this visit?  {CCM Required [phone]}     Plan:     I have personally reviewed and noted the following in the patient's chart:   Medical and social history Use of alcohol, tobacco or illicit drugs  Current medications and supplements including opioid prescriptions. {Opioid Prescriptions:[phone]} Functional ability and status Nutritional status Physical activity Advanced directives List of other physicians Hospitalizations, surgeries, and ER visits in previous 12 months Vitals Screenings to include cognitive, depression, and falls Referrals and appointments  In addition, I have reviewed and discussed with patient certain preventive protocols, quality metrics, and best practice recommendations. A written personalized care plan for preventive services as well as general preventive health recommendations were provided to patient.     Dewayne Ford, LPN   1/61/0960   After Visit Summary: {CHL AMB AWV After Visit [phone]}  Notes: {Nurse Notes:32343}

## 2023-06-14 DIAGNOSIS — H35342 Macular cyst, hole, or pseudohole, left eye: Secondary | ICD-10-CM | POA: Diagnosis not present

## 2023-07-06 DIAGNOSIS — Z961 Presence of intraocular lens: Secondary | ICD-10-CM | POA: Diagnosis not present

## 2023-07-06 DIAGNOSIS — H35342 Macular cyst, hole, or pseudohole, left eye: Secondary | ICD-10-CM | POA: Diagnosis not present

## 2023-07-06 DIAGNOSIS — H2511 Age-related nuclear cataract, right eye: Secondary | ICD-10-CM | POA: Diagnosis not present

## 2023-10-02 ENCOUNTER — Ambulatory Visit (INDEPENDENT_AMBULATORY_CARE_PROVIDER_SITE_OTHER): Admitting: Family Medicine

## 2023-10-02 NOTE — Progress Notes (Signed)
 Attempted to contact the patient multiple time, no answer and VM was full.

## 2023-10-13 LAB — HM MAMMOGRAPHY

## 2023-10-30 ENCOUNTER — Encounter: Payer: Self-pay | Admitting: Family Medicine

## 2023-10-30 ENCOUNTER — Ambulatory Visit (INDEPENDENT_AMBULATORY_CARE_PROVIDER_SITE_OTHER): Admitting: Family Medicine

## 2023-10-30 ENCOUNTER — Telehealth: Payer: Self-pay | Admitting: *Deleted

## 2023-10-30 VITALS — BP 90/60 | HR 65 | Temp 97.5°F

## 2023-10-30 DIAGNOSIS — Z0000 Encounter for general adult medical examination without abnormal findings: Secondary | ICD-10-CM | POA: Diagnosis not present

## 2023-10-30 DIAGNOSIS — R5383 Other fatigue: Secondary | ICD-10-CM

## 2023-10-30 DIAGNOSIS — Z1211 Encounter for screening for malignant neoplasm of colon: Secondary | ICD-10-CM

## 2023-10-30 DIAGNOSIS — Z780 Asymptomatic menopausal state: Secondary | ICD-10-CM

## 2023-10-30 DIAGNOSIS — Z13220 Encounter for screening for lipoid disorders: Secondary | ICD-10-CM | POA: Diagnosis not present

## 2023-10-30 LAB — CBC WITH DIFFERENTIAL/PLATELET
Basophils Absolute: 0.1 K/uL (ref 0.0–0.1)
Basophils Relative: 1 % (ref 0.0–3.0)
Eosinophils Absolute: 0.3 K/uL (ref 0.0–0.7)
Eosinophils Relative: 4.9 % (ref 0.0–5.0)
HCT: 44.1 % (ref 36.0–46.0)
Hemoglobin: 15.1 g/dL — ABNORMAL HIGH (ref 12.0–15.0)
Lymphocytes Relative: 34.7 % (ref 12.0–46.0)
Lymphs Abs: 2 K/uL (ref 0.7–4.0)
MCHC: 34.2 g/dL (ref 30.0–36.0)
MCV: 95.5 fl (ref 78.0–100.0)
Monocytes Absolute: 0.6 K/uL (ref 0.1–1.0)
Monocytes Relative: 10.5 % (ref 3.0–12.0)
Neutro Abs: 2.8 K/uL (ref 1.4–7.7)
Neutrophils Relative %: 48.9 % (ref 43.0–77.0)
Platelets: 158 K/uL (ref 150.0–400.0)
RBC: 4.62 Mil/uL (ref 3.87–5.11)
RDW: 12.8 % (ref 11.5–15.5)
WBC: 5.8 K/uL (ref 4.0–10.5)

## 2023-10-30 LAB — COMPREHENSIVE METABOLIC PANEL WITH GFR
ALT: 20 U/L (ref 0–35)
AST: 26 U/L (ref 0–37)
Albumin: 4.3 g/dL (ref 3.5–5.2)
Alkaline Phosphatase: 55 U/L (ref 39–117)
BUN: 15 mg/dL (ref 6–23)
CO2: 26 meq/L (ref 19–32)
Calcium: 9.3 mg/dL (ref 8.4–10.5)
Chloride: 103 meq/L (ref 96–112)
Creatinine, Ser: 0.73 mg/dL (ref 0.40–1.20)
GFR: 84.71 mL/min (ref >60.00)
Glucose, Bld: 91 mg/dL (ref 70–99)
Potassium: 4 meq/L (ref 3.5–5.1)
Sodium: 136 meq/L (ref 135–145)
Total Bilirubin: 0.6 mg/dL (ref 0.2–1.2)
Total Protein: 7.4 g/dL (ref 6.0–8.3)

## 2023-10-30 LAB — LIPID PANEL
Cholesterol: 163 mg/dL (ref 0–200)
HDL: 49.7 mg/dL (ref >39.00)
LDL Cholesterol: 89 mg/dL (ref 0–99)
NonHDL: 113.39
Total CHOL/HDL Ratio: 3
Triglycerides: 122 mg/dL (ref 0.0–149.0)
VLDL: 24.4 mg/dL (ref 0.0–40.0)

## 2023-10-30 LAB — TSH: TSH: 8.34 u[IU]/mL — ABNORMAL HIGH (ref 0.35–5.50)

## 2023-10-30 NOTE — Telephone Encounter (Signed)
 Copied from CRM [id number]. Topic: Clinical - Lab/Test Results >> Oct 30, 2023  4:26 PM Corin V wrote: Reason for CRM: Patient received a message that she had lab results available. Advised that results were back but not reviewed by PCP yet. She asked that someone call when results can be reviewed as she cannot access her MyChart account.

## 2023-10-30 NOTE — Patient Instructions (Signed)

## 2023-10-30 NOTE — Progress Notes (Signed)
 Complete physical exam  Patient: Patricia Burns   DOB: 05-14-1955   68 y.o. Female  MRN: [id number]  Subjective:    Chief Complaint  Patient presents with   Annual Exam    Patricia Burns is a 68 y.o. female who presents today for a complete physical exam. She reports consuming a general diet, pt states  I sometimes I don't eat like I should. Tries to eat fruits and veggies, eats eggs and chicken regularly. Rarely eats convenience food, occasional fast food.  The patient does not participate in regular exercise at present. She generally feels well. She reports sleeping fairly well. She does not have additional problems to discuss today.    Most recent fall risk assessment:    10/30/2023    9:13 AM  Fall Risk   Falls in the past year? 1  Number falls in past yr: 0  Injury with Fall? 1  Risk for fall due to : Impaired balance/gait  Follow up Falls evaluation completed     Most recent depression screenings:    10/30/2023    9:14 AM 09/29/2022   10:52 AM  PHQ 2/9 Scores  PHQ - 2 Score 0 0  PHQ- 9 Score 1     Vision:Within last year and Dental: No current dental problems and Receives regular dental care  Patient Active Problem List   Diagnosis Date Noted   Fatty infiltration of liver 10/01/2021   Facial asymmetry 07/06/2021   GERD (gastroesophageal reflux disease) 02/21/2017   Hx of abnormal cervical Pap smear - sees gyn 10/02/2013   H/O bee sting allergy 10/02/2013   Borderline abnormal TFTs 10/02/2013      Patient Care Team: Ozell Heron HERO, MD as PCP - General (Family Medicine) Austin Ned, MD as PCP - OBGYN (Obstetrics and Gynecology) Koberlein, Junell C, MD (Inactive) as Consulting Physician (Family Medicine)   Outpatient Medications Prior to Visit  Medication Sig   Cholecalciferol (VITAMIN D3) 1000 units CAPS Take 1 capsule by mouth daily.   Cyanocobalamin  (B-12 PO) Take by mouth daily.   fluticasone  (FLONASE ) 50 MCG/ACT nasal spray Place 1 spray into  both nostrils daily.   [DISCONTINUED] CALCIUM PO Take by mouth daily. (Patient not taking: Reported on 03/20/2023)   [DISCONTINUED] omeprazole  (PRILOSEC) 20 MG capsule Take 2 capsules (40 mg total) by mouth daily. (Patient taking differently: Take 20 mg by mouth daily.)   Facility-Administered Medications Prior to Visit  Medication Dose Route Frequency Provider   0.9 %  sodium chloride  infusion  500 mL Intravenous Once Armbruster, Elspeth SQUIBB, MD    Review of Systems  HENT:  Negative for hearing loss.   Eyes:  Negative for blurred vision.  Respiratory:  Negative for shortness of breath.   Cardiovascular:  Negative for chest pain.  Gastrointestinal: Negative.   Genitourinary: Negative.   Musculoskeletal:  Negative for back pain.  Neurological:  Negative for headaches.  Psychiatric/Behavioral:  Negative for depression.         Objective:     BP 90/60   Pulse 65   Temp (!) 97.5 F (36.4 C) (Oral)   LMP 09/02/2013   SpO2 98%    Physical Exam Vitals reviewed.  Constitutional:      Appearance: Normal appearance. She is well-groomed and normal weight.  HENT:     Right Ear: Tympanic membrane and ear canal normal.     Left Ear: Tympanic membrane and ear canal normal.     Mouth/Throat:  Mouth: Mucous membranes are moist.     Pharynx: No posterior oropharyngeal erythema.  Eyes:     Conjunctiva/sclera: Conjunctivae normal.  Neck:     Thyroid : No thyromegaly.  Cardiovascular:     Rate and Rhythm: Normal rate and regular rhythm.     Pulses: Normal pulses.     Heart sounds: S1 normal and S2 normal.  Pulmonary:     Effort: Pulmonary effort is normal.     Breath sounds: Normal breath sounds and air entry.  Abdominal:     General: Abdomen is flat. Bowel sounds are normal.     Palpations: Abdomen is soft.  Musculoskeletal:     Right lower leg: No edema.     Left lower leg: No edema.  Lymphadenopathy:     Cervical: No cervical adenopathy.  Neurological:     Mental Status: She  is alert and oriented to person, place, and time. Mental status is at baseline.     Gait: Gait is intact.  Psychiatric:        Mood and Affect: Mood and affect normal.        Speech: Speech normal.        Behavior: Behavior normal.        Judgment: Judgment normal.      No results found for any visits on 10/30/23.     Assessment & Plan:    Routine Health Maintenance and Physical Exam  Immunization History  Administered Date(s) Administered   Influenza,inj,Quad PF,6+ Mos 03/24/2016, 12/29/2016, 01/16/2018   Tdap 01/16/2018    Health Maintenance  Topic Date Due   Pneumococcal Vaccine: 50+ Years (1 of 1 - PCV) Never done   Zoster Vaccines- Shingrix (1 of 2) Never done   Colonoscopy  04/06/2022   Mammogram  09/09/2022   Medicare Annual Wellness (AWV)  06/03/2023   COVID-19 Vaccine (1 - 2024-25 season) Never done   Influenza Vaccine  05/14/2024 (Originally 09/15/2023)   DTaP/Tdap/Td (2 - Td or Tdap) 01/17/2028   DEXA SCAN  Completed   Hepatitis C Screening  Completed   HPV VACCINES  Aged Out   Meningococcal B Vaccine  Aged Out    Discussed health benefits of physical activity, and encouraged her to engage in regular exercise appropriate for her age and condition.  Other fatigue -     TSH; Future  Lipid screening -     Lipid panel; Future  Routine general medical examination at a health care facility -     Comprehensive metabolic panel with GFR; Future -     CBC with Differential/Platelet; Future  Colon cancer screening -     Ambulatory referral to Gastroenterology  Postmenopausal state -     DG Bone Density; Future  General physical exam findings are normal today. I reviewed the patient's preventative testing, immunizations, and lifestyle habits. I made appropriate recommendations and placed orders for the appropriate tests and/or vaccinations. I counseled the patient on the CDC's recommendations for healthy exercise and diet. I counseled the patient on healthy sleep  habits and stress management. Handouts to reinforce the counseling were given at the conclusion of the visit.    Return in 1 year (on 10/29/2024).     Heron CHRISTELLA Sharper, MD

## 2023-10-31 ENCOUNTER — Ambulatory Visit: Payer: Self-pay | Admitting: Family Medicine

## 2023-10-31 DIAGNOSIS — E039 Hypothyroidism, unspecified: Secondary | ICD-10-CM

## 2023-11-02 NOTE — Telephone Encounter (Signed)
 Noted

## 2023-11-02 NOTE — Telephone Encounter (Signed)
 I left her a message on my chart, she just needs to schedule a lab appointment for more labs to be drawn

## 2023-11-03 ENCOUNTER — Other Ambulatory Visit

## 2023-11-03 DIAGNOSIS — E039 Hypothyroidism, unspecified: Secondary | ICD-10-CM

## 2023-11-06 LAB — THYROGLOBULIN ANTIBODY: Thyroglobulin Ab: <1 [IU]/mL (ref <=1)

## 2023-11-08 ENCOUNTER — Ambulatory Visit: Payer: Self-pay | Admitting: Family Medicine

## 2023-11-08 DIAGNOSIS — E039 Hypothyroidism, unspecified: Secondary | ICD-10-CM

## 2023-11-11 LAB — ANTI-TPO AB (RDL): Anti-TPO Ab (RDL): <9 [IU]/mL (ref <9.0)

## 2023-12-18 ENCOUNTER — Other Ambulatory Visit (INDEPENDENT_AMBULATORY_CARE_PROVIDER_SITE_OTHER)

## 2023-12-18 DIAGNOSIS — E039 Hypothyroidism, unspecified: Secondary | ICD-10-CM | POA: Diagnosis not present

## 2023-12-18 LAB — TSH: TSH: 5.37 u[IU]/mL (ref 0.35–5.50)

## 2023-12-18 LAB — T4, FREE: Free T4: 0.62 ng/dL (ref 0.60–1.60)

## 2023-12-19 ENCOUNTER — Telehealth: Payer: Self-pay | Admitting: *Deleted

## 2023-12-19 NOTE — Telephone Encounter (Signed)
 Copied from CRM [id number]. Topic: Clinical - Lab/Test Results >> Dec 19, 2023  8:49 AM Carlyon D wrote: Reason for CRM: Pt calling in regards to her results she would like some one to give her a call back to discuss. Call back # [phone].

## 2023-12-19 NOTE — Telephone Encounter (Signed)
 Patient informed of the results from 9/30 and is aware she will be contacted once results from 11/3 are reviewed by PCP.

## 2024-02-12 ENCOUNTER — Telehealth: Payer: Self-pay

## 2024-02-12 NOTE — Telephone Encounter (Signed)
 Copied from CRM #[id number]. Topic: Clinical - Medical Advice >> Feb 12, 2024  4:29 PM Shereese L wrote: Reason for CRM: Patient called in and wants the office to call her to adv why she needs a gastro doctor.

## 2024-02-13 NOTE — Telephone Encounter (Signed)
 Spoke with the patient and advised her a referral was placed to GI for a colonoscopy due to colon cancer screening.

## 2024-02-28 ENCOUNTER — Encounter: Payer: Self-pay | Admitting: Physician Assistant

## 2024-03-26 ENCOUNTER — Ambulatory Visit: Admitting: Physician Assistant
